# Patient Record
Sex: Female | Born: 1935 | Race: White | Hispanic: No | Smoking: Former smoker
Health system: Southern US, Community
[De-identification: ages and names within clinical notes are randomized; demographics above are authoritative.]

## PROBLEM LIST (undated history)

## (undated) DIAGNOSIS — R55 Syncope and collapse: Secondary | ICD-10-CM

## (undated) DIAGNOSIS — C801 Malignant (primary) neoplasm, unspecified: Secondary | ICD-10-CM

## (undated) DIAGNOSIS — F329 Major depressive disorder, single episode, unspecified: Secondary | ICD-10-CM

## (undated) DIAGNOSIS — F32A Depression, unspecified: Secondary | ICD-10-CM

## (undated) DIAGNOSIS — F419 Anxiety disorder, unspecified: Secondary | ICD-10-CM

## (undated) DIAGNOSIS — K5790 Diverticulosis of intestine, part unspecified, without perforation or abscess without bleeding: Secondary | ICD-10-CM

## (undated) DIAGNOSIS — R748 Abnormal levels of other serum enzymes: Secondary | ICD-10-CM

## (undated) DIAGNOSIS — C4492 Squamous cell carcinoma of skin, unspecified: Secondary | ICD-10-CM

## (undated) DIAGNOSIS — D049 Carcinoma in situ of skin, unspecified: Secondary | ICD-10-CM

## (undated) DIAGNOSIS — N390 Urinary tract infection, site not specified: Secondary | ICD-10-CM

## (undated) DIAGNOSIS — I639 Cerebral infarction, unspecified: Secondary | ICD-10-CM

## (undated) DIAGNOSIS — C4491 Basal cell carcinoma of skin, unspecified: Secondary | ICD-10-CM

## (undated) DIAGNOSIS — M1990 Unspecified osteoarthritis, unspecified site: Secondary | ICD-10-CM

## (undated) DIAGNOSIS — D099 Carcinoma in situ, unspecified: Secondary | ICD-10-CM

## (undated) DIAGNOSIS — E785 Hyperlipidemia, unspecified: Secondary | ICD-10-CM

## (undated) DIAGNOSIS — I10 Essential (primary) hypertension: Secondary | ICD-10-CM

## (undated) HISTORY — DX: Essential (primary) hypertension: I10

## (undated) HISTORY — DX: Syncope and collapse: R55

## (undated) HISTORY — DX: Carcinoma in situ, unspecified: D09.9

## (undated) HISTORY — DX: Unspecified osteoarthritis, unspecified site: M19.90

## (undated) HISTORY — DX: Anxiety disorder, unspecified: F41.9

## (undated) HISTORY — DX: Urinary tract infection, site not specified: N39.0

## (undated) HISTORY — PX: SQUAMOUS CELL CARCINOMA EXCISION: SHX2433

## (undated) HISTORY — DX: Carcinoma in situ of skin, unspecified: D04.9

## (undated) HISTORY — DX: Major depressive disorder, single episode, unspecified: F32.9

## (undated) HISTORY — DX: Basal cell carcinoma of skin, unspecified: C44.91

## (undated) HISTORY — DX: Depression, unspecified: F32.A

## (undated) HISTORY — DX: Squamous cell carcinoma of skin, unspecified: C44.92

## (undated) HISTORY — PX: BASAL CELL CARCINOMA EXCISION: SHX1214

## (undated) HISTORY — DX: Malignant (primary) neoplasm, unspecified: C80.1

## (undated) HISTORY — DX: Diverticulosis of intestine, part unspecified, without perforation or abscess without bleeding: K57.90

## (undated) HISTORY — DX: Cerebral infarction, unspecified: I63.9

## (undated) HISTORY — DX: Hyperlipidemia, unspecified: E78.5

## (undated) HISTORY — DX: Abnormal levels of other serum enzymes: R74.8

---

## 1991-08-19 DIAGNOSIS — C4491 Basal cell carcinoma of skin, unspecified: Secondary | ICD-10-CM

## 1991-08-19 HISTORY — DX: Basal cell carcinoma of skin, unspecified: C44.91

## 1999-05-23 ENCOUNTER — Other Ambulatory Visit: Admission: RE | Admit: 1999-05-23 | Discharge: 1999-05-23 | Payer: Self-pay | Admitting: Internal Medicine

## 2000-10-16 ENCOUNTER — Other Ambulatory Visit: Admission: RE | Admit: 2000-10-16 | Discharge: 2000-10-16 | Payer: Self-pay | Admitting: Internal Medicine

## 2001-03-16 DIAGNOSIS — D099 Carcinoma in situ, unspecified: Secondary | ICD-10-CM

## 2001-03-16 HISTORY — DX: Carcinoma in situ, unspecified: D09.9

## 2006-04-02 DIAGNOSIS — D049 Carcinoma in situ of skin, unspecified: Secondary | ICD-10-CM

## 2006-04-02 HISTORY — DX: Carcinoma in situ of skin, unspecified: D04.9

## 2006-12-28 ENCOUNTER — Encounter: Payer: Self-pay | Admitting: Internal Medicine

## 2008-02-21 ENCOUNTER — Ambulatory Visit: Payer: Self-pay | Admitting: Gastroenterology

## 2008-03-06 ENCOUNTER — Ambulatory Visit: Payer: Self-pay | Admitting: Gastroenterology

## 2009-08-29 DIAGNOSIS — C4491 Basal cell carcinoma of skin, unspecified: Secondary | ICD-10-CM

## 2009-08-29 HISTORY — DX: Basal cell carcinoma of skin, unspecified: C44.91

## 2010-01-29 ENCOUNTER — Encounter: Payer: Self-pay | Admitting: Internal Medicine

## 2010-02-05 ENCOUNTER — Encounter: Payer: Self-pay | Admitting: Internal Medicine

## 2010-02-06 DIAGNOSIS — R079 Chest pain, unspecified: Secondary | ICD-10-CM | POA: Insufficient documentation

## 2010-02-06 DIAGNOSIS — I10 Essential (primary) hypertension: Secondary | ICD-10-CM | POA: Insufficient documentation

## 2010-02-06 DIAGNOSIS — E785 Hyperlipidemia, unspecified: Secondary | ICD-10-CM | POA: Insufficient documentation

## 2010-02-07 ENCOUNTER — Ambulatory Visit: Payer: Self-pay | Admitting: Internal Medicine

## 2010-03-07 ENCOUNTER — Ambulatory Visit: Payer: Self-pay

## 2010-03-07 ENCOUNTER — Encounter: Payer: Self-pay | Admitting: Internal Medicine

## 2010-03-07 ENCOUNTER — Ambulatory Visit (HOSPITAL_COMMUNITY): Admission: RE | Admit: 2010-03-07 | Discharge: 2010-03-07 | Payer: Self-pay | Admitting: Cardiology

## 2010-03-07 ENCOUNTER — Encounter (INDEPENDENT_AMBULATORY_CARE_PROVIDER_SITE_OTHER): Payer: Self-pay | Admitting: *Deleted

## 2010-03-07 ENCOUNTER — Ambulatory Visit: Payer: Self-pay | Admitting: Cardiology

## 2010-03-12 ENCOUNTER — Ambulatory Visit: Payer: Self-pay | Admitting: Internal Medicine

## 2010-03-12 DIAGNOSIS — R0602 Shortness of breath: Secondary | ICD-10-CM | POA: Insufficient documentation

## 2010-03-12 DIAGNOSIS — R609 Edema, unspecified: Secondary | ICD-10-CM | POA: Insufficient documentation

## 2010-03-19 LAB — CONVERTED CEMR LAB
Albumin: 3.8 g/dL (ref 3.5–5.2)
BUN: 23 mg/dL (ref 6–23)
Basophils Absolute: 0 10*3/uL (ref 0.0–0.1)
Chloride: 102 meq/L (ref 96–112)
Eosinophils Absolute: 0.3 10*3/uL (ref 0.0–0.7)
GFR calc non Af Amer: 88.68 mL/min (ref >60)
Glucose, Bld: 96 mg/dL (ref 70–99)
HCT: 38.5 % (ref 36.0–46.0)
Hemoglobin: 13.2 g/dL (ref 12.0–15.0)
Lymphs Abs: 1.7 10*3/uL (ref 0.7–4.0)
MCHC: 34.4 g/dL (ref 30.0–36.0)
MCV: 92.6 fL (ref 78.0–100.0)
Neutro Abs: 3 10*3/uL (ref 1.4–7.7)
Potassium: 4.4 meq/L (ref 3.5–5.1)
RDW: 14.2 % (ref 11.5–14.6)

## 2010-03-21 ENCOUNTER — Ambulatory Visit: Payer: Self-pay | Admitting: Internal Medicine

## 2010-03-22 ENCOUNTER — Inpatient Hospital Stay (HOSPITAL_BASED_OUTPATIENT_CLINIC_OR_DEPARTMENT_OTHER): Admission: RE | Admit: 2010-03-22 | Discharge: 2010-03-22 | Payer: Self-pay | Admitting: Internal Medicine

## 2010-03-22 ENCOUNTER — Ambulatory Visit: Payer: Self-pay | Admitting: Internal Medicine

## 2010-03-28 LAB — CONVERTED CEMR LAB
CO2: 30 meq/L (ref 19–32)
Calcium: 9.2 mg/dL (ref 8.4–10.5)
Chloride: 100 meq/L (ref 96–112)
Sodium: 136 meq/L (ref 135–145)

## 2010-06-25 ENCOUNTER — Ambulatory Visit: Payer: Self-pay | Admitting: Internal Medicine

## 2010-09-24 ENCOUNTER — Ambulatory Visit: Payer: Self-pay | Admitting: Internal Medicine

## 2010-11-03 HISTORY — PX: CATARACT EXTRACTION: SUR2

## 2010-12-03 NOTE — Assessment & Plan Note (Signed)
Summary: PER CHECK OUT   Visit Type:  Follow-up Primary Kashus Karlen:  Dr Geoffry Paradise  CC:  pt states she sometimes forgets to take her lasix and kdur. pt has sob with exer.Marland Kitchen  History of Present Illness: Pam Mejia is 77 y/o woman with  HTN and previous TIA x2. Referred earlier this year by  Dr. Jacky Kindle for further evaluation of 2 episodes of CP.  Had stress echo which taised concern about ischemia in distal septum. F/u cath 03/22/2010: Normal cors. Normal LV function. LVEDP 14. BNP ranges 100-137.   Under a lot of stress taking care of her husband with advanced CHF and 4 year old daughter who is moving back home after losing her job.   For daily activities feels fine. But when she hurries feels SOB. [place] doing rec[place] and treadmill but unable to do it more than 2 times per week or stay more than 20 minutes due to her husband. Gets a little SOB doing those activities. Feels like she is getting stronger. No problem with groin site.   Feels overwhelmed and stressed. No interested in anti-depressants. Says she will just drink her 2 toddies (Candaian VO) prior to dinner.      Current Medications (verified): 1)  Triamterene-Hctz 37.5-25 Mg Caps (Triamterene-Hctz) .... Take 1 Tablet By Mouth Once A Day 2)  Aspirin 81 Mg Tbec (Aspirin) .... Take One Tablet By Mouth Daily 3)  Calcium Carbonate-Vitamin D 600-400 Mg-Unit  Tabs (Calcium Carbonate-Vitamin D) .... Take 2 Tabs By Mouth Two Times A Day 4)  Glucosamine-Chondroitin   Caps (Glucosamine-Chondroit-Vit C-Mn) .... Once Daily 5)  Vitamin D 1000 Unit  Tabs (Cholecalciferol) .... Once Daily 6)  Furosemide 20 Mg Tabs (Furosemide) .... Take One Tablet By Mouth Daily On Mondays and Fridays 7)  Potassium Chloride Crys Cr 20 Meq Cr-Tabs (Potassium Chloride Crys Cr) .... Take One Tablet By Mouth Daily On Mondays and Fridays  Allergies (verified): No Known Drug Allergies  Past History:  Past Medical History: Last updated:  02/07/2010 1. Chest Pain 2. HTN 3. GERD 4. Hx of TIA 5. Osteopenia 6. Osteoarthritis  Review of Systems       As per HPI and past medical history; otherwise all systems negative.   Vital Signs:  Patient profile:   77 year old female Height:      64 inches Weight:      160 pounds BMI:     27.56 Pulse rate:   57 / minute BP sitting:   140 / 80  (left arm) Cuff size:   regular  Vitals Entered By: Burnett Kanaris, CNA (June 25, 2010 10:09 AM)  Physical Exam  General:  Gen: elderly well appearing. no resp difficulty HEENT: normal Neck: supple. JVP flat. Carotids 2+ bilat; no bruits. No lymphadenopathy or thryomegaly appreciated. Cor: PMI nondisplaced. Regular rate & rhythm. No rubs, gallops, murmur. Lungs: clear Abdomen: soft, nontender, nondistended. No hepatosplenomegaly. No bruits or masses. Good bowel sounds. Extremities: no cyanosis, clubbing, rash, tr edema Neuro: alert & orientedx3, cranial nerves grossly intact. moves all 4 extremities w/o difficulty. affect pleasant    Impression & Recommendations:  Problem # 1:  DYSPNEA (ICD-786.05) Her cardiac cath is very reassuring. She does appear to have some mild diastolic dysfunction but LVEDP was normal at cath. I suspect a large component of her symptoms are likely due to emotional stress and she admits to this. I urged her to try to continue with her exercise regimen 3x/week and take care of  her self. I also asked her to discuss the possibility of counseling or taking an anti-depressant with Dr. Dawson Bills and be careful with her alcohol intake. She agreed to this and was a bit tearful with this discussion. If symptoms persist, we will consider cardiopulmonary exercise test to further evaluate.   Other Orders: EKG w/ Interpretation (93000)  Patient Instructions: 1)  Your physician recommends that you schedule a follow-up appointment in: 3 months. 2)  Your physician recommends that you continue on your current medications  as directed. Please refer to the Current Medication list given to you today.

## 2010-12-03 NOTE — Assessment & Plan Note (Signed)
Summary: 1 month rov   Visit Type:  Follow-up Primary Provider:  Dr Geoffry Paradise  CC:  shortness of breath.  History of Present Illness: Pam Mejia is 77 y/o woman with  HTN and previous TIA x2. Referred last month by  Dr. Jacky Kindle for further evaluation of 2 episodes of CP.  Had stress echo last Thursday which has not been formally read yet. I looked at images briefly and appeared relatively normal.  Walked on TM for 6 minutes. No signifcant ST abnormalities.   Notes that she gets SBO going up and down stairs. Feels it is getting worse. No CP or wheezing. Does have mild ankle swelling by end of the day. Doing water aerobics. Does have some dyspnea with this but unchanged. No orthopnea or PND. No cough. Had CXR recently with Dr. Sharma Covert she says was fine.   Still taking care of husband with CHF.   Addendum: Reviewed stress echo result with Dr. Jens Som and he raised concerned about stress induced ischemia in distal septum.    Current Medications (verified): 1)  Triamterene-Hctz 37.5-25 Mg Caps (Triamterene-Hctz) .... Take 1 Tablet By Mouth Once A Day 2)  Aspirin 81 Mg Tbec (Aspirin) .... Take One Tablet By Mouth Daily 3)  Calcium Carbonate-Vitamin D 600-400 Mg-Unit  Tabs (Calcium Carbonate-Vitamin D) .... Take 2 Tabs By Mouth Two Times A Day 4)  Glucosamine-Chondroitin   Caps (Glucosamine-Chondroit-Vit C-Mn) .... Once Daily 5)  Vitamin D 1000 Unit  Tabs (Cholecalciferol) .... Once Daily  Allergies (verified): No Known Drug Allergies  Past History:  Past Surgical History: Last updated: 02/06/2010 removal of recurrent squamous cell ca --- 2/10 removal of basal cell ca ---9/08 & 10/08  Family History: Last updated: 02/06/2010 Family History of Cancer:  Parkinson's disease  Social History: Last updated: 02/07/2010 Married w/ 2 children & 2 grandchildren Tobacco Use - No.  Alcohol Use - yes -- little daily Regular Exercise - yes Drug Use - no Retired   Risk  Factors: Exercise: yes (02/06/2010)  Risk Factors: Smoking Status: never (02/06/2010)  Past Medical History: Reviewed history from 02/07/2010 and no changes required. 1. Chest Pain 2. HTN 3. GERD 4. Hx of TIA 5. Osteopenia 6. Osteoarthritis  Review of Systems       As per HPI and past medical history; otherwise all systems negative.   Vital Signs:  Patient profile:   77 year old female Height:      64 inches Weight:      165 pounds BMI:     28.42 Pulse rate:   65 / minute BP sitting:   126 / 68  (left arm) Cuff size:   regular  Vitals Entered By: Hardin Negus, RMA (Mar 12, 2010 10:57 AM)  Physical Exam  General:  Gen: elderly well appearing. no resp difficulty HEENT: normal Neck: supple. JVP 6. Carotids 2+ bilat; no bruits. No lymphadenopathy or thryomegaly appreciated. Cor: PMI nondisplaced. Regular rate & rhythm. No rubs, gallops, murmur. Lungs: clear Abdomen: soft, nontender, nondistended. No hepatosplenomegaly. No bruits or masses. Good bowel sounds. Extremities: no cyanosis, clubbing, rash, 1+ edema Neuro: alert & orientedx3, cranial nerves grossly intact. moves all 4 extremities w/o difficulty. affect pleasant    Impression & Recommendations:  Problem # 1:  CHEST PAIN-UNSPECIFIED (ICD-786.50) Resolved. However, stress echo reviewed with Dr. Jens Som after she left and is suggestive of possible ischemia. We will contact her to discuss the possiblity of proceeding to R and L heart cath to further evlauate.   Problem #  2:  DYSPNEA (ICD-786.05) Likely multifactorial. Does appear to have component of diastolic dysfunction with very mild volume overload. Will try adding lasix 20/kcl 20 on Mon and Fri and see if it helps. Chceck CBC, BMET and BNP. Given results of stress echo will likely need cath.    Other Orders: TLB-BMP (Basic Metabolic Panel-BMET) (80048-METABOL) TLB-BNP (B-Natriuretic Peptide) (83880-BNPR) TLB-CBC Platelet - w/Differential  (85025-CBCD) TLB-Hepatic/Liver Function Pnl (80076-HEPATIC)  Patient Instructions: 1)  Labs today 2)  Furosemide 20mg  on Mondays and Fridays 3)  Potassium on Mondays and Fridays 4)  Your physician recommends that you return for lab work in: 2 weeks (bmet, bnp 786.05, 786.51) 5)  Follow up in 2-3 months Prescriptions: POTASSIUM CHLORIDE CRYS CR 20 MEQ CR-TABS (POTASSIUM CHLORIDE CRYS CR) Take one tablet by mouth daily on Mondays and Fridays  #10 x 3   Entered by:   Meredith Staggers, RN   Authorized by:   Dolores Patty, MD, [facility]   Signed by:   Meredith Staggers, RN on 03/12/2010   Method used:   Electronically to        [facility] [address] #[id number]* (retail)       [address]       [place], [place]  [address]       Ph: [phone]       Fax: [phone]   RxID:   [id number] FUROSEMIDE 20 MG TABS (FUROSEMIDE) Take one tablet by mouth daily on Mondays and Fridays  #10 x 3   Entered by:   Meredith Staggers, RN   Authorized by:   Dolores Patty, MD, [facility]   Signed by:   Meredith Staggers, RN on 03/12/2010   Method used:   Electronically to        [facility] [address] #[id number]* (retail)       [address]       [place], [place]  [address]       Ph: [phone]       Fax: [phone]   RxID:   [id number]

## 2010-12-03 NOTE — Letter (Signed)
Summary: Outpatient Coinsurance Notice  Outpatient Coinsurance Notice   Imported By: Marylou Mccoy 03/08/2010 15:11:45  _____________________________________________________________________  External Attachment:    Type:   Image     Comment:   External Document

## 2010-12-03 NOTE — Cardiovascular Report (Signed)
Summary: Pre Cath Orders  Pre Cath Orders   Imported By: Roderic Ovens 03/28/2010 12:38:57  _____________________________________________________________________  External Attachment:    Type:   Image     Comment:   External Document

## 2010-12-03 NOTE — Letter (Signed)
Summary: [facility] Office Note  [facility] Office Note   Imported By: Roderic Ovens 02/15/2010 13:51:22  _____________________________________________________________________  External Attachment:    Type:   Image     Comment:   External Document

## 2010-12-03 NOTE — Assessment & Plan Note (Signed)
Summary: np6/ per dr.aronson/ chestpain x 2/work up / pt has blue medi...   Visit Type:  Initial Consult Primary Provider:  Dr Geoffry Paradise  CC:  chest pain x 2 in Feb.  History of Present Illness: 77 y/o HTN and previous TIA x2. Referred b Dr. Jacky Kindle for further evaluation of 2 episodes of CP.  Typically very active but less so latel as she has been taking care of husband suffering from CHF. Ver stressed.  Still tries to go to gym 3x/week and does recumbent bike and weights.  In February woke up from sleep with pain in side under L breast. Lasted only a few minutes and then subsided. No other sx.  Had another episode of pain when riding recumbent bike. Brief. Stopped with cessation of exercise. Never returned. Occasional SOB with swimming.   No fevers, chills. Nonporductive cough due to allergies.   Recent lipid TC 196 TG 56 HDL 98(!) LDL 87  Problems Prior to Update: 1)  Hyperlipidemia-mixed  (ICD-272.4) 2)  Hypertension, Unspecified  (ICD-401.9) 3)  Chest Pain-unspecified  (ICD-786.50)  Medications Prior to Update: 1)  Triamterene-Hctz 37.5-25 Mg Caps (Triamterene-Hctz) .... Take 1 Tablet By Mouth Once A Day 2)  Aspirin 81 Mg Tbec (Aspirin) .... Take One Tablet By Mouth Daily 3)  Calcium Carbonate-Vitamin D 600-400 Mg-Unit  Tabs (Calcium Carbonate-Vitamin D) .... Take 2 Tabs By Mouth Two Times A Day 4)  Glucosamine-Chondroitin   Caps (Glucosamine-Chondroit-Vit C-Mn) .... Once Daily  Current Medications (verified): 1)  Triamterene-Hctz 37.5-25 Mg Caps (Triamterene-Hctz) .... Take 1 Tablet By Mouth Once A Day 2)  Aspirin 81 Mg Tbec (Aspirin) .... Take One Tablet By Mouth Daily 3)  Calcium Carbonate-Vitamin D 600-400 Mg-Unit  Tabs (Calcium Carbonate-Vitamin D) .... Take 2 Tabs By Mouth Two Times A Day 4)  Glucosamine-Chondroitin   Caps (Glucosamine-Chondroit-Vit C-Mn) .... Once Daily 5)  Vitamin D 1000 Unit  Tabs (Cholecalciferol) .... Once Daily  Allergies (verified): No  Known Drug Allergies  Past History:  Past Medical History: 1. Chest Pain 2. HTN 3. GERD 4. Hx of TIA 5. Osteopenia 6. Osteoarthritis  Family History: Reviewed history from 02/06/2010 and no changes required. Family History of Cancer:  Parkinson's disease  Social History: Reviewed history from 02/06/2010 and no changes required. Married w/ 2 children & 2 grandchildren Tobacco Use - No.  Alcohol Use - yes -- little daily Regular Exercise - yes Drug Use - no Retired   Review of Systems       As per HPI and past medical history; otherwise all systems negative.   Vital Signs:  Patient profile:   77 year old female Height:      64 inches Weight:      165 pounds BMI:     28.42 Pulse rate:   58 / minute BP sitting:   146 / 74  (left arm) Cuff size:   regular  Vitals Entered By: Hardin Negus, RMA (February 07, 2010 2:43 PM)  Physical Exam  General:  Gen: elderly well appearing. no resp difficulty HEENT: normal Neck: supple. no JVD. Carotids 2+ bilat; no bruits. No lymphadenopathy or thryomegaly appreciated. Cor: PMI nondisplaced. Regular rate & rhythm. No rubs, gallops, murmur. Lungs: clear Abdomen: soft, nontender, nondistended. No hepatosplenomegaly. No bruits or masses. Good bowel sounds. Extremities: no cyanosis, clubbing, rash, edema Neuro: alert & orientedx3, cranial nerves grossly intact. moves all 4 extremities w/o difficulty. affect pleasant    Impression & Recommendations:  Problem # 1:  CHEST  PAIN-UNSPECIFIED (ICD-786.50) Mostly atypical but does have risk factors. Has agreed to participate in [facility] trial of traditional stress testing (stress echo in this case) vs coronary CT.   Problem # 2:  HYPERTENSION, UNSPECIFIED (ICD-401.9) Blood pressure well controlled. Continue current regimen.  Other Orders: EKG w/ Interpretation (93000) Stress Echo (Stress Echo)  Patient Instructions: 1)  Your physician has requested that you have a stress  echocardiogram. For further information please visit [email or web address]  Please follow instruction sheet as given. 2)  Follow up in 1 month

## 2010-12-03 NOTE — Letter (Signed)
Summary: Cardiac Catheterization Instructions- [facility]  [facility], Main Office  [address]   [place], [place] [address]   Phone: [phone]  Fax: [phone]     03/12/2010 MRN: [id number]  Pam Mejia [address] [place], [place]  [address]  Dear [name],   You are scheduled for a Cardiac Catheterization on Friday 03/22/10 with Dr. Gala Romney  Please arrive to the 1st floor of the [facility] at [facility] at 8:30 am / pm on the day of your procedure. Please do not arrive before 6:30 a.m. Call the Heart and Vascular Center at [phone] if you are unable to make your appointmnet. The Code to get into the parking garage under the building is 0010. Take the elevators to the 1st floor. You must have someone to drive you home. Someone must be with you for the first 24 hours after you arrive home. Please wear clothes that are easy to get on and off and wear slip-on shoes. Do not eat or drink after midnight except water with your medications that morning. Bring all your medications and current insurance cards with you.  _X__ DO NOT take these medications before your procedure: ______triamterene/hctz__________________________________________________________  ___ Make sure you take your aspirin.  ___ You may take ALL of your medications with water that morning. ________________________________________________________________________________________________________________________________  ___ DO NOT take ANY medications before your procedure.  ___ Pre-med instructions:  ________________________________________________________________________________________________________________________________  The usual length of stay after your procedure is 2 to 3 hours. This can vary.  If you have any questions, please call the office at the number listed above.   Meredith Staggers, RN

## 2010-12-03 NOTE — Assessment & Plan Note (Signed)
Summary: rov   Primary Provider:  Dr Pam Mejia   History of Present Illness: Pam Mejia is 77 y/o woman with  HTN and previous TIA x2. Referred earlier this year by  Dr. Jacky Kindle for further evaluation of 2 episodes of CP and dyspnea.   Had stress echo which raised concern about ischemia in distal septum. F/u cath 03/22/2010: Normal cors. Normal LV function. LVEDP 14. BNP ranges 100-137.   Under a lot of stress taking care of her husband with advanced CHF and 16 year old daughter who moved back home after losing her job.   Says she feels fine. Denies chest or SOB.  Trying her best to be active. Going to pool 2x/week.        Current Medications (verified): 1)  Triamterene-Hctz 37.5-25 Mg Caps (Triamterene-Hctz) .... Take 1 Tablet By Mouth Once A Day 2)  Aspirin 81 Mg Tbec (Aspirin) .... Take One Tablet By Mouth Daily 3)  Calcium Carbonate-Vitamin D 600-400 Mg-Unit  Tabs (Calcium Carbonate-Vitamin D) .... Take 2 Tabs By Mouth Two Times A Day 4)  Glucosamine-Chondroitin   Caps (Glucosamine-Chondroit-Vit C-Mn) .... Once Daily 5)  Vitamin D 1000 Unit  Tabs (Cholecalciferol) .... Once Daily 6)  Furosemide 20 Mg Tabs (Furosemide) .... Take One Tablet By Mouth Daily On Mondays and Fridays 7)  Potassium Chloride Crys Cr 20 Meq Cr-Tabs (Potassium Chloride Crys Cr) .... Take One Tablet By Mouth Daily On Mondays and Fridays  Allergies (verified): No Known Drug Allergies  Past History:  Past Medical History: 1. Chest Pain    --cath with normal coronaries 5/11 2. HTN 3. GERD 4. Hx of TIA 5. Osteopenia 6. Osteoarthritis  Review of Systems       As per HPI and past medical history; otherwise all systems negative.   Vital Signs:  Patient profile:   77 year old female Height:      64 inches Weight:      163 pounds BMI:     28.08 Pulse rate:   62 / minute Resp:     16 per minute BP sitting:   122 / 60  (right arm)  Vitals Entered By: Marrion Coy, CNA (September 24, 2010 10:17  AM)  Physical Exam  General:  Elderly well appearing. no resp difficulty HEENT: normal except for bandage on nose at site MOHS surgery Neck: supple. JVP flat. Carotids 2+ bilat; no bruits. No lymphadenopathy or thryomegaly appreciated. Cor: PMI nondisplaced. Regular rate & rhythm. No rubs, gallops, murmur. Lungs: clear Abdomen: soft, nontender, nondistended. No hepatosplenomegaly. No bruits or masses. Good bowel sounds. Extremities: no cyanosis, clubbing, rash, tr edema Neuro: alert & orientedx3, cranial nerves grossly intact. moves all 4 extremities w/o difficulty. affect pleasant    Impression & Recommendations:  Problem # 1:  CHEST PAIN-UNSPECIFIED (ICD-786.50)/Dyspnea Cath normal. Symptoms resolved. Can f/u as needed. Continue exercise program. Asked her to call me with problems.   Patient Instructions: 1)  We will see you back on an as needed basis.

## 2011-01-20 LAB — PROTIME-INR
INR: 0.92 (ref 0.00–1.49)
Prothrombin Time: 12.3 seconds (ref 11.6–15.2)

## 2011-02-04 ENCOUNTER — Ambulatory Visit: Payer: BC Managed Care – HMO | Admitting: Internal Medicine

## 2011-04-03 ENCOUNTER — Encounter: Payer: Self-pay | Admitting: Cardiology

## 2011-04-03 ENCOUNTER — Other Ambulatory Visit: Payer: Self-pay | Admitting: Internal Medicine

## 2011-05-12 ENCOUNTER — Other Ambulatory Visit: Payer: Self-pay

## 2011-08-28 ENCOUNTER — Other Ambulatory Visit: Payer: Self-pay | Admitting: Internal Medicine

## 2011-10-14 ENCOUNTER — Other Ambulatory Visit: Payer: Self-pay | Admitting: Internal Medicine

## 2012-03-10 ENCOUNTER — Encounter: Payer: Self-pay | Admitting: Gastroenterology

## 2012-04-01 ENCOUNTER — Encounter: Payer: Self-pay | Admitting: Gastroenterology

## 2012-04-01 ENCOUNTER — Ambulatory Visit (INDEPENDENT_AMBULATORY_CARE_PROVIDER_SITE_OTHER): Payer: Medicare Other | Admitting: Gastroenterology

## 2012-04-01 VITALS — BP 128/70 | HR 72 | Ht 64.0 in | Wt 165.6 lb

## 2012-04-01 DIAGNOSIS — R195 Other fecal abnormalities: Secondary | ICD-10-CM

## 2012-04-01 MED ORDER — MOVIPREP 100 G PO SOLR: 1.0000 | Freq: Once | ORAL | Status: DC

## 2012-04-01 NOTE — Patient Instructions (Signed)
You have been scheduled for a colonoscopy with propofol. Please follow written instructions given to you at your visit today.  Please pick up your prep kit at the pharmacy within the next 1-3 days. cc: Geoffry Paradise, MD

## 2012-04-01 NOTE — Progress Notes (Signed)
History of Present Illness: This is an 78 year old female recently found to have Hemoccult positive stool. She occasionally notes straining with bowel movements. She previously underwent colonoscopy in March 2003 showing a small hyperplastic colon polyp, diverticulosis and internal hemorrhoids-that were treated. She underwent colonoscopy in May 2009 for hematochezia with diverticulosis and internal and external hemorrhoids noted. Her internal hemorrhoids were retreated. Denies weight loss, abdominal pain, diarrhea, change in stool caliber, melena, hematochezia, nausea, vomiting, dysphagia, reflux symptoms, chest pain.  No Known Allergies Outpatient Prescriptions Prior to Visit  Medication Sig Dispense Refill  . aspirin 81 MG tablet Take 81 mg by mouth daily.        . furosemide (LASIX) 20 MG tablet TAKE 1 TABLET BY MOUTH DAILY ON MONDAYS AND FRIDAYS  10 tablet  9  . lisinopril-hydrochlorothiazide (PRINZIDE,ZESTORETIC) 20-25 MG per tablet Take 1 tablet by mouth daily.        . potassium chloride SA (K-DUR,KLOR-CON) 20 MEQ tablet TAKE 1 TABLET BY MOUTH DAILY ON MONDAYS AND FRIDAYS  10 tablet  6  . Travoprost (TRAVATAN OP) Apply 1 drop to eye. Both eyes        . ciprofloxacin (CIPRO XR) 500 MG 24 hr tablet Take 500 mg by mouth 2 (two) times daily.        . simvastatin (ZOCOR) 40 MG tablet Take 40 mg by mouth at bedtime.         Past Medical History  Diagnosis Date  . UTI (urinary tract infection)   . Syncope   . Elevated CK     likely secondary to trauma  . HTN (hypertension)   . Hyperlipidemia   . Glaucoma   . Osteoarthritis   . Diverticulosis   . Migraine    Past Surgical History  Procedure Date  . Squamous cell carcinoma excision   . Basal cell carcinoma excision   . Cataract extraction    History   Social History  . Marital Status: Married    Spouse Name: N/A    Number of Children: 2  . Years of Education: N/A   Occupational History  . Retired    Social History Main  Topics  . Smoking status: Never Smoker   . Smokeless tobacco: Never Used  . Alcohol Use: Yes  . Drug Use: No  . Sexually Active: None   Other Topics Concern  . None   Social History Narrative  . None   Family History  Problem Relation Age of Onset  . Stomach cancer Mother   . Heart disease Father     Review of Systems: Pertinent positive and negative review of systems were noted in the above HPI section. All other review of systems were otherwise negative.  Physical Exam: General: Well developed , well nourished, no acute distress Head: Normocephalic and atraumatic Eyes:  sclerae anicteric, EOMI Ears: Normal auditory acuity Mouth: No deformity or lesions Neck: Supple, no masses or thyromegaly Lungs: Clear throughout to auscultation Heart: Regular rate and rhythm; no murmurs, rubs or bruits Abdomen: Soft, non tender and non distended. No masses, hepatosplenomegaly or hernias noted. Normal Bowel sounds Rectal: Deferred to colonoscopy Musculoskeletal: Symmetrical with no gross deformities  Skin: No lesions on visible extremities Pulses:  Normal pulses noted Extremities: No clubbing, cyanosis, edema or deformities noted Neurological: Alert oriented x 4, grossly nonfocal Cervical Nodes:  No significant cervical adenopathy Inguinal Nodes: No significant inguinal adenopathy Psychological:  Alert and cooperative. Normal mood and affect  Assessment and Recommendations:  1. Hemoccult-positive stool  and constipation. Rule out colorectal lesions. I suspect hemorrhoids are the most likely cause of Hemoccult-positive stool given that she underwent colonoscopy in 2009. Increase dietary fiber and water intake. The risks, benefits, and alternatives to colonoscopy with possible biopsy and possible polypectomy were discussed with the patient and they consent to proceed.

## 2012-04-07 ENCOUNTER — Encounter: Payer: Self-pay | Admitting: Gastroenterology

## 2012-05-29 ENCOUNTER — Other Ambulatory Visit: Payer: Self-pay | Admitting: Internal Medicine

## 2012-05-31 ENCOUNTER — Other Ambulatory Visit (HOSPITAL_COMMUNITY): Payer: Self-pay

## 2012-05-31 MED ORDER — POTASSIUM CHLORIDE CRYS ER 20 MEQ PO TBCR: 20.0000 meq | EXTENDED_RELEASE_TABLET | Freq: Every day | ORAL | Status: DC

## 2012-05-31 NOTE — Telephone Encounter (Signed)
..   Requested Prescriptions   Signed Prescriptions Disp Refills  . potassium chloride SA (K-DUR,KLOR-CON) 20 MEQ tablet 10 tablet 6    Sig: Take 1 tablet (20 mEq total) by mouth daily.    Authorizing Provider: Dolores Patty    Ordering User: Christella Hartigan, Abhiraj Dozal Judie Petit

## 2012-06-01 ENCOUNTER — Encounter: Payer: Self-pay | Admitting: Gastroenterology

## 2012-06-01 ENCOUNTER — Ambulatory Visit (AMBULATORY_SURGERY_CENTER): Payer: Medicare Other | Admitting: Gastroenterology

## 2012-06-01 VITALS — BP 142/70 | HR 49 | Temp 97.6°F | Resp 18 | Ht 64.0 in | Wt 165.0 lb

## 2012-06-01 DIAGNOSIS — K621 Rectal polyp: Secondary | ICD-10-CM

## 2012-06-01 DIAGNOSIS — D126 Benign neoplasm of colon, unspecified: Secondary | ICD-10-CM

## 2012-06-01 DIAGNOSIS — K620 Anal polyp: Secondary | ICD-10-CM

## 2012-06-01 DIAGNOSIS — R195 Other fecal abnormalities: Secondary | ICD-10-CM

## 2012-06-01 MED ORDER — SODIUM CHLORIDE 0.9 % IV SOLN: 500.0000 mL | INTRAVENOUS | Status: DC

## 2012-06-01 NOTE — Progress Notes (Signed)
Pressure applied to the abdomen to reach the cecum 

## 2012-06-01 NOTE — Patient Instructions (Addendum)

## 2012-06-01 NOTE — Op Note (Signed)
[facility] [address] [place], [place]  [address]  COLONOSCOPY PROCEDURE REPORT PATIENT:  Pam Mejia, Pam Mejia  MR#:  [id number] BIRTHDATE:  18-Jan-1929, 83 yrs. old  GENDER:  female ENDOSCOPIST:  Judie Petit T. Russella Dar, MD, [facility]  PROCEDURE DATE:  06/01/2012 PROCEDURE:  Colonoscopy with snare polypectomy ASA CLASS:  Class II INDICATIONS:  1) heme positive stool MEDICATIONS:   MAC sedation, administered by CRNA, propofol (Diprivan) 80 mg IV DESCRIPTION OF PROCEDURE:   After the risks benefits and alternatives of the procedure were thoroughly explained, informed consent was obtained.  Digital rectal exam was performed and revealed external hemorrhoids.   The LB [id number]-[id number] endoscope was introduced through the anus and advanced to the cecum, which was identified by both the appendix and ileocecal valve, without limitations.  The quality of the prep was excellent, using MoviPrep.  The instrument was then slowly withdrawn as the colon was fully examined. <<PROCEDUREIMAGES>> FINDINGS:  Moderate diverticulosis was found in the sigmoid colon. A sessile polyp was found in the rectum. It was 6 mm in size. Polyp was snared without cautery. Retrieval was successful. Otherwise normal colonoscopy without other polyps, masses, vascular ectasias, or inflammatory changes.   Retroflexed views in the rectum revealed internal hemorrhoids, large.  The time to cecum =  5.5  minutes. The scope was then withdrawn (time =  11.25 min) from the patient and the procedure completed.  COMPLICATIONS:  None  ENDOSCOPIC IMPRESSION: 1) Moderate diverticulosis in the sigmoid colon 2) 6 mm sessile polyp in the rectum 3) Internal and external hemorrhoids  RECOMMENDATIONS: 1) Await pathology results 2) High fiber diet with liberal fluid intake. 3) Given your age, you will not need another colonoscopy for colon cancer screening or polyp surveillance. These types of tests usually stop around the age  59.  Venita Lick. Russella Dar, MD, [facility]  CC:  Geoffry Paradise, MD  n. Rosalie DoctorVenita Lick. Masayuki Sakai at 06/01/2012 11:31 AM  [place], [phone]

## 2012-06-01 NOTE — Progress Notes (Signed)
Patient did not experience any of the following events: a burn prior to discharge; a fall within the facility; wrong site/side/patient/procedure/implant event; or a hospital transfer or hospital admission upon discharge from the facility. (G8907) Patient did not have preoperative order for IV antibiotic SSI prophylaxis. (G8918)  

## 2012-06-02 ENCOUNTER — Telehealth: Payer: Self-pay | Admitting: *Deleted

## 2012-06-02 NOTE — Telephone Encounter (Signed)
  Follow up Call-  Call back number 06/01/2012  Post procedure Call Back phone  # [phone]  Permission to leave phone message Yes     Patient questions:  Do you have a fever, pain , or abdominal swelling? no Pain Score  0 *  Have you tolerated food without any problems? yes  Have you been able to return to your normal activities? yes  Do you have any questions about your discharge instructions: Diet   no Medications  no Follow up visit  no  Do you have questions or concerns about your Care? no  Actions: * If pain score is 4 or above: No action needed, pain <4.

## 2012-06-02 NOTE — Telephone Encounter (Signed)
Duplicate entry.  Phone call not made.

## 2012-06-07 ENCOUNTER — Encounter: Payer: Self-pay | Admitting: Gastroenterology

## 2012-09-21 ENCOUNTER — Other Ambulatory Visit: Payer: Self-pay | Admitting: Internal Medicine

## 2012-11-10 ENCOUNTER — Other Ambulatory Visit: Payer: Self-pay | Admitting: Dermatology

## 2012-12-28 ENCOUNTER — Telehealth: Payer: Self-pay | Admitting: Internal Medicine

## 2012-12-28 NOTE — Telephone Encounter (Signed)
Dr Gala Romney saw pt in Nov 2011 for CP and advised she could f/u prn, if she is wanting to be seen again she can just see any cardiologist since she does not have heart failure, thanks

## 2012-12-28 NOTE — Telephone Encounter (Signed)
New Problem:    Patient called in wanting to know if she was still eligible to see Dr. Gala Romney in the CHF clinic.  Please let me know.

## 2013-01-13 ENCOUNTER — Ambulatory Visit (INDEPENDENT_AMBULATORY_CARE_PROVIDER_SITE_OTHER): Payer: Medicare Other | Admitting: Internal Medicine

## 2013-01-13 ENCOUNTER — Encounter: Payer: Self-pay | Admitting: Internal Medicine

## 2013-01-13 VITALS — BP 110/68 | HR 61 | Ht 64.0 in | Wt 163.0 lb

## 2013-01-13 DIAGNOSIS — I10 Essential (primary) hypertension: Secondary | ICD-10-CM

## 2013-01-13 DIAGNOSIS — R0602 Shortness of breath: Secondary | ICD-10-CM

## 2013-01-13 NOTE — Progress Notes (Signed)
HPI Pam Mejia is 79 y/o woman with HTN and previous TIA x2. Referred earlier this year by Dr. Jacky Kindle for further evaluation of 2 episodes of CP and dyspnea.  Had stress echo which raised concern about ischemia in distal septum. F/u cath 03/22/2010: Normal cors. Normal LV function. LVEDP 14. BNP ranges 100-137.  She was last seen by D Bensimhon in 2011. She represented for evaluation of shortness of breath.  She remains under increased stress.  Husband is in [place].  She visits him daily  Admits to not being as physically active  Denies CP  No PND. SHe had been on Triamterene as well as Lasix/KCL a few times pper week.  Ran out in December Since running out she notes no real change in her sympotms.  No Known Allergies  Current Outpatient Prescriptions  Medication Sig Dispense Refill  . aspirin 81 MG tablet Take 81 mg by mouth daily.        . diclofenac sodium (VOLTAREN) 1 % GEL Apply 1 application topically as needed.      . Naproxen Sodium (ALEVE) 220 MG CAPS Take 1 capsule by mouth as needed.      . Travoprost (TRAVATAN OP) Apply 1 drop to eye. Both eyes         No current facility-administered medications for this visit.    Past Medical History  Diagnosis Date  . UTI (urinary tract infection)   . Syncope   . Elevated CK     likely secondary to trauma  . HTN (hypertension)   . Hyperlipidemia   . Glaucoma(365)   . Osteoarthritis   . Diverticulosis   . Migraine   . Anxiety   . Cancer     melanoma  . Depression   . Stroke     pt states she has had TIA's    Past Surgical History  Procedure Laterality Date  . Squamous cell carcinoma excision    . Basal cell carcinoma excision    . Cataract extraction  2012    bilateral    Family History  Problem Relation Age of Onset  . Stomach cancer Mother   . Heart disease Father     History   Social History  . Marital Status: Married    Spouse Name: N/A    Number of Children: 2  . Years of Education: N/A   Occupational  History  . Retired    Social History Main Topics  . Smoking status: Never Smoker   . Smokeless tobacco: Never Used  . Alcohol Use: 0.0 oz/week     Comment: 2 mixed drinks/ day  . Drug Use: No  . Sexually Active: Not on file   Other Topics Concern  . Not on file   Social History Narrative  . No narrative on file    Review of Systems:  All systems reviewed.  They are negative to the above problem except as previously stated.  Vital Signs: BP 110/68  Pulse 61  Ht 5\' 4"  (1.626 m)  Wt 163 lb (73.936 kg)  BMI 27.97 kg/m2  SpO2 97%  Physical Exam Patient is in NAD HEENT:  Normocephalic, atraumatic. EOMI, PERRLA.  Neck: JVP is normal.  No bruits.  Lungs: clear to auscultation. No rales no wheezes.  Heart: Regular rate and rhythm. Normal S1, S2. No S3.   No significant murmurs. PMI not displaced.  Abdomen:  Supple, nontender. Normal bowel sounds. No masses. No hepatomegaly.  Extremities:   Good distal pulses throughout. Tr  lower extremity edema.  Musculoskeletal :moving all extremities.  Neuro:   alert and oriented x3.  CN II-XII grossly intact.  EKG  Sinus bradycardia  54 bpm. Assessment and Plan:  1.  SOB  I would recomm labs today (CBC, TSH, BMET and BNP)  She should also get an echo to evla LV and RV function.    2.  Hx TIA.  FOlow.

## 2013-01-13 NOTE — Patient Instructions (Addendum)
LABS TODAY:  BMET, BNP, TSH, CBC  Your physician has requested that you have an echocardiogram. Echocardiography is a painless test that uses sound waves to create images of your heart. It provides your doctor with information about the size and shape of your heart and how well your heart's chambers and valves are working. This procedure takes approximately one hour. There are no restrictions for this procedure.

## 2013-01-14 LAB — CBC WITH DIFFERENTIAL/PLATELET
Basophils Relative: 1 % (ref 0.0–3.0)
Eosinophils Relative: 4.4 % (ref 0.0–5.0)
HCT: 38.1 % (ref 36.0–46.0)
Hemoglobin: 12.9 g/dL (ref 12.0–15.0)
Lymphocytes Relative: 34.7 % (ref 12.0–46.0)
Lymphs Abs: 2.3 10*3/uL (ref 0.7–4.0)
Monocytes Relative: 16.3 % — ABNORMAL HIGH (ref 3.0–12.0)
Neutro Abs: 2.9 10*3/uL (ref 1.4–7.7)
RBC: 4.29 Mil/uL (ref 3.87–5.11)
RDW: 13.5 % (ref 11.5–14.6)
WBC: 6.7 10*3/uL (ref 4.5–10.5)

## 2013-01-14 LAB — BASIC METABOLIC PANEL
BUN: 23 mg/dL (ref 6–23)
CO2: 27 mEq/L (ref 19–32)
Calcium: 8.8 mg/dL (ref 8.4–10.5)
Chloride: 101 mEq/L (ref 96–112)
Creatinine, Ser: 0.7 mg/dL (ref 0.4–1.2)
Glucose, Bld: 103 mg/dL — ABNORMAL HIGH (ref 70–99)

## 2013-01-25 ENCOUNTER — Ambulatory Visit (HOSPITAL_COMMUNITY): Payer: Medicare Other | Attending: Cardiovascular Disease

## 2013-01-25 DIAGNOSIS — R0989 Other specified symptoms and signs involving the circulatory and respiratory systems: Secondary | ICD-10-CM

## 2013-01-25 DIAGNOSIS — Z8673 Personal history of transient ischemic attack (TIA), and cerebral infarction without residual deficits: Secondary | ICD-10-CM | POA: Insufficient documentation

## 2013-01-25 DIAGNOSIS — R0609 Other forms of dyspnea: Secondary | ICD-10-CM

## 2013-01-25 DIAGNOSIS — R55 Syncope and collapse: Secondary | ICD-10-CM | POA: Insufficient documentation

## 2013-01-25 DIAGNOSIS — I10 Essential (primary) hypertension: Secondary | ICD-10-CM | POA: Insufficient documentation

## 2013-01-25 DIAGNOSIS — R079 Chest pain, unspecified: Secondary | ICD-10-CM | POA: Insufficient documentation

## 2013-01-25 DIAGNOSIS — R0602 Shortness of breath: Secondary | ICD-10-CM | POA: Insufficient documentation

## 2013-01-25 NOTE — Progress Notes (Signed)
Echocardiogram performed.  

## 2013-01-31 ENCOUNTER — Telehealth: Payer: Self-pay | Admitting: Internal Medicine

## 2013-01-31 NOTE — Telephone Encounter (Signed)
Pt notified of echo results

## 2013-01-31 NOTE — Telephone Encounter (Signed)
Follow up call   Returning call back to nurse  

## 2013-01-31 NOTE — Telephone Encounter (Signed)
New Problem:    Patient called in returning your call regarding her ECHO results.  Please call back.

## 2013-01-31 NOTE — Telephone Encounter (Signed)
LMTCB

## 2014-12-11 ENCOUNTER — Other Ambulatory Visit: Payer: Self-pay | Admitting: Dermatology

## 2014-12-11 DIAGNOSIS — C4492 Squamous cell carcinoma of skin, unspecified: Secondary | ICD-10-CM

## 2014-12-11 HISTORY — DX: Squamous cell carcinoma of skin, unspecified: C44.92

## 2015-07-14 ENCOUNTER — Ambulatory Visit (INDEPENDENT_AMBULATORY_CARE_PROVIDER_SITE_OTHER): Payer: Medicare Other | Admitting: Physician Assistant

## 2015-07-14 VITALS — BP 134/66 | HR 64 | Temp 97.9°F | Resp 16 | Ht 63.0 in | Wt 159.2 lb

## 2015-07-14 DIAGNOSIS — H6091 Unspecified otitis externa, right ear: Secondary | ICD-10-CM | POA: Diagnosis not present

## 2015-07-14 MED ORDER — OFLOXACIN 0.3 % OT SOLN: 10.0000 [drp] | Freq: Every day | OTIC | Status: AC

## 2015-07-14 NOTE — Progress Notes (Signed)
[facility] [address], [place] [address] [phone]  Date:  07/14/2015   Name:  Pam MCLAINE   DOB:  Oct 24, 1929   MRN:  [id number]  PCP:  Geoffery Lyons, MD    Chief Complaint: Ear Pain   History of Present Illness:  This is a 81 y.o. female with PMH HLD, HTN who is presenting with right ear pain x 1 week. Pain is gradually worsening. Pain located to inner ear and her posterior ear. Both ears feel full but no particular difficulty hearing. No pain in left ear. No drainage from the ear. States she had a cold 5 weeks ago but that resolved. No current URI sx other than ear pain. She denies fever or chills. She has not tried anything for her sx.   Review of Systems:  Review of Systems See HPI  Patient Active Problem List   Diagnosis Date Noted  . EDEMA 03/12/2010  . DYSPNEA 03/12/2010  . HYPERLIPIDEMIA-MIXED 02/06/2010  . HYPERTENSION, UNSPECIFIED 02/06/2010  . CHEST PAIN-UNSPECIFIED 02/06/2010    Prior to Admission medications   Medication Sig Start Date End Date Taking? Authorizing Provider  aspirin 81 MG tablet Take 81 mg by mouth daily.     Yes Historical Provider, MD  diclofenac sodium (VOLTAREN) 1 % GEL Apply 1 application topically as needed.   Yes Historical Provider, MD  Naproxen Sodium (ALEVE) 220 MG CAPS Take 1 capsule by mouth as needed.   Yes Historical Provider, MD  triamterene-hydrochlorothiazide (MAXZIDE-25) 37.5-25 MG per tablet Take 1 tablet by mouth daily.   Yes Historical Provider, MD    No Known Allergies  Past Surgical History  Procedure Laterality Date  . Squamous cell carcinoma excision    . Basal cell carcinoma excision    . Cataract extraction  2012    bilateral    Social History  Substance Use Topics  . Smoking status: Never Smoker   . Smokeless tobacco: Never Used  . Alcohol Use: 0.0 oz/week     Comment: 2 mixed drinks/ day    Family History  Problem Relation Age of Onset  . Stomach cancer Mother    . Heart disease Father     Medication list has been reviewed and updated.  Physical Examination:  Physical Exam  Constitutional: She is oriented to person, place, and time. She appears well-developed and well-nourished. No distress.  HENT:  Head: Normocephalic and atraumatic.  Right Ear: Hearing and external ear normal.  Left Ear: Hearing, tympanic membrane, external ear and ear canal normal.  Nose: Nose normal.  Mouth/Throat: Uvula is midline, oropharynx is clear and moist and mucous membranes are normal.  Right ear canal blocked by cerumen. Lavage and removal of wax with alligator forceps partially successful. Adherent piece of wax remaining. Canal extremely erythematous and inflamed. Canal not swollen. Only medial portion of TM visualized but normal. No pain with manipulation of auricle or tragus. Tenderness over skin posterior and superior to auricle on temporal bone. No erythema or swelling. No mastoid tenderness.  Eyes: Conjunctivae and lids are normal. Right eye exhibits no discharge. Left eye exhibits no discharge. No scleral icterus.  Cardiovascular: Normal rate, regular rhythm, normal heart sounds and normal pulses.   No murmur heard. Pulmonary/Chest: Effort normal and breath sounds normal. No respiratory distress.  Musculoskeletal: Normal range of motion.  Lymphadenopathy:       Head (right side): No submental, no submandibular and no tonsillar adenopathy present.  Head (left side): No submental, no submandibular and no tonsillar adenopathy present.    She has no cervical adenopathy.  Neurological: She is alert and oriented to person, place, and time.  Skin: Skin is warm, dry and intact. No lesion and no rash noted.  Psychiatric: She has a normal mood and affect. Her speech is normal and behavior is normal. Thought content normal.   BP 134/66 mmHg  Pulse 64  Temp(Src) 97.9 F (36.6 C) (Oral)  Resp 16  Ht 5\' 3"  (1.6 m)  Wt 159 lb 3.2 oz (72.213 kg)  BMI 28.21  kg/m2  SpO2 97%  Assessment and Plan:  1. Otitis externa, right Right ear canal red and inflamed - will treat with floxin otic x 1 week. TTP superior postauricular region. No swelling or erythema. Low likelihood for mastoiditis as tenderness is more superior. Tylenol for pain. Advised she return to clinic if her symptoms are not starting to improve in 48 hours or sooner if sx worsen. Discussed return precautions. - ofloxacin (FLOXIN) 0.3 % otic solution; Place 10 drops into the right ear daily.  Dispense: 5 mL; Refill: 0   Nicole V. Drenda Freeze, MHS [facility]  07/14/2015

## 2015-07-14 NOTE — Patient Instructions (Signed)
Apply ten drops in right ear once a day for 7 days. Take tylenol for pain. If your symptoms are not starting to improve in 48 hours, let me know. Return at any time if you develop decreased hearing, fever or chills.

## 2015-12-11 DIAGNOSIS — B353 Tinea pedis: Secondary | ICD-10-CM | POA: Diagnosis not present

## 2015-12-11 DIAGNOSIS — B359 Dermatophytosis, unspecified: Secondary | ICD-10-CM | POA: Diagnosis not present

## 2015-12-11 DIAGNOSIS — L728 Other follicular cysts of the skin and subcutaneous tissue: Secondary | ICD-10-CM | POA: Diagnosis not present

## 2015-12-11 DIAGNOSIS — L570 Actinic keratosis: Secondary | ICD-10-CM | POA: Diagnosis not present

## 2016-02-05 DIAGNOSIS — D485 Neoplasm of uncertain behavior of skin: Secondary | ICD-10-CM | POA: Diagnosis not present

## 2016-03-06 DIAGNOSIS — Z1231 Encounter for screening mammogram for malignant neoplasm of breast: Secondary | ICD-10-CM | POA: Diagnosis not present

## 2016-03-07 ENCOUNTER — Other Ambulatory Visit: Payer: Self-pay | Admitting: Ophthalmology

## 2016-03-07 DIAGNOSIS — L821 Other seborrheic keratosis: Secondary | ICD-10-CM | POA: Diagnosis not present

## 2016-03-07 DIAGNOSIS — D485 Neoplasm of uncertain behavior of skin: Secondary | ICD-10-CM | POA: Diagnosis not present

## 2016-04-16 DIAGNOSIS — L821 Other seborrheic keratosis: Secondary | ICD-10-CM | POA: Diagnosis not present

## 2016-04-16 DIAGNOSIS — Z961 Presence of intraocular lens: Secondary | ICD-10-CM | POA: Diagnosis not present

## 2016-06-03 DIAGNOSIS — E784 Other hyperlipidemia: Secondary | ICD-10-CM | POA: Diagnosis not present

## 2016-06-03 DIAGNOSIS — M859 Disorder of bone density and structure, unspecified: Secondary | ICD-10-CM | POA: Diagnosis not present

## 2016-06-03 DIAGNOSIS — I10 Essential (primary) hypertension: Secondary | ICD-10-CM | POA: Diagnosis not present

## 2016-06-11 DIAGNOSIS — Z0000 Encounter for general adult medical examination without abnormal findings: Secondary | ICD-10-CM | POA: Diagnosis not present

## 2016-06-11 DIAGNOSIS — Z6827 Body mass index (BMI) 27.0-27.9, adult: Secondary | ICD-10-CM | POA: Diagnosis not present

## 2016-06-11 DIAGNOSIS — G459 Transient cerebral ischemic attack, unspecified: Secondary | ICD-10-CM | POA: Diagnosis not present

## 2016-06-11 DIAGNOSIS — E784 Other hyperlipidemia: Secondary | ICD-10-CM | POA: Diagnosis not present

## 2016-06-11 DIAGNOSIS — D692 Other nonthrombocytopenic purpura: Secondary | ICD-10-CM | POA: Diagnosis not present

## 2016-06-11 DIAGNOSIS — M1990 Unspecified osteoarthritis, unspecified site: Secondary | ICD-10-CM | POA: Diagnosis not present

## 2016-06-11 DIAGNOSIS — I10 Essential (primary) hypertension: Secondary | ICD-10-CM | POA: Diagnosis not present

## 2016-06-11 DIAGNOSIS — R079 Chest pain, unspecified: Secondary | ICD-10-CM | POA: Diagnosis not present

## 2016-06-11 DIAGNOSIS — M859 Disorder of bone density and structure, unspecified: Secondary | ICD-10-CM | POA: Diagnosis not present

## 2016-06-11 DIAGNOSIS — Z1389 Encounter for screening for other disorder: Secondary | ICD-10-CM | POA: Diagnosis not present

## 2016-06-16 DIAGNOSIS — Z1212 Encounter for screening for malignant neoplasm of rectum: Secondary | ICD-10-CM | POA: Diagnosis not present

## 2016-07-23 DIAGNOSIS — M170 Bilateral primary osteoarthritis of knee: Secondary | ICD-10-CM | POA: Diagnosis not present

## 2016-07-23 DIAGNOSIS — M47812 Spondylosis without myelopathy or radiculopathy, cervical region: Secondary | ICD-10-CM | POA: Diagnosis not present

## 2016-08-16 DIAGNOSIS — Z23 Encounter for immunization: Secondary | ICD-10-CM | POA: Diagnosis not present

## 2016-12-11 DIAGNOSIS — I10 Essential (primary) hypertension: Secondary | ICD-10-CM | POA: Diagnosis not present

## 2016-12-11 DIAGNOSIS — I872 Venous insufficiency (chronic) (peripheral): Secondary | ICD-10-CM | POA: Diagnosis not present

## 2016-12-11 DIAGNOSIS — G459 Transient cerebral ischemic attack, unspecified: Secondary | ICD-10-CM | POA: Diagnosis not present

## 2016-12-11 DIAGNOSIS — E784 Other hyperlipidemia: Secondary | ICD-10-CM | POA: Diagnosis not present

## 2017-01-13 DIAGNOSIS — Z6828 Body mass index (BMI) 28.0-28.9, adult: Secondary | ICD-10-CM | POA: Diagnosis not present

## 2017-01-13 DIAGNOSIS — H6121 Impacted cerumen, right ear: Secondary | ICD-10-CM | POA: Diagnosis not present

## 2017-01-16 DIAGNOSIS — G501 Atypical facial pain: Secondary | ICD-10-CM | POA: Diagnosis not present

## 2017-01-16 DIAGNOSIS — Z6828 Body mass index (BMI) 28.0-28.9, adult: Secondary | ICD-10-CM | POA: Diagnosis not present

## 2017-01-16 DIAGNOSIS — H9202 Otalgia, left ear: Secondary | ICD-10-CM | POA: Diagnosis not present

## 2017-03-09 DIAGNOSIS — Z1231 Encounter for screening mammogram for malignant neoplasm of breast: Secondary | ICD-10-CM | POA: Diagnosis not present

## 2017-03-11 DIAGNOSIS — L814 Other melanin hyperpigmentation: Secondary | ICD-10-CM | POA: Diagnosis not present

## 2017-03-11 DIAGNOSIS — Z85828 Personal history of other malignant neoplasm of skin: Secondary | ICD-10-CM | POA: Diagnosis not present

## 2017-03-11 DIAGNOSIS — L821 Other seborrheic keratosis: Secondary | ICD-10-CM | POA: Diagnosis not present

## 2017-04-15 DIAGNOSIS — L814 Other melanin hyperpigmentation: Secondary | ICD-10-CM | POA: Diagnosis not present

## 2017-04-15 DIAGNOSIS — L570 Actinic keratosis: Secondary | ICD-10-CM | POA: Diagnosis not present

## 2017-04-15 DIAGNOSIS — L821 Other seborrheic keratosis: Secondary | ICD-10-CM | POA: Diagnosis not present

## 2017-05-04 DIAGNOSIS — Z961 Presence of intraocular lens: Secondary | ICD-10-CM | POA: Diagnosis not present

## 2017-05-04 DIAGNOSIS — L821 Other seborrheic keratosis: Secondary | ICD-10-CM | POA: Diagnosis not present

## 2017-05-13 DIAGNOSIS — E663 Overweight: Secondary | ICD-10-CM | POA: Diagnosis not present

## 2017-05-13 DIAGNOSIS — H01003 Unspecified blepharitis right eye, unspecified eyelid: Secondary | ICD-10-CM | POA: Diagnosis not present

## 2017-05-13 DIAGNOSIS — I872 Venous insufficiency (chronic) (peripheral): Secondary | ICD-10-CM | POA: Diagnosis not present

## 2017-05-13 DIAGNOSIS — G501 Atypical facial pain: Secondary | ICD-10-CM | POA: Diagnosis not present

## 2017-06-10 DIAGNOSIS — E784 Other hyperlipidemia: Secondary | ICD-10-CM | POA: Diagnosis not present

## 2017-06-10 DIAGNOSIS — M859 Disorder of bone density and structure, unspecified: Secondary | ICD-10-CM | POA: Diagnosis not present

## 2017-06-10 DIAGNOSIS — I10 Essential (primary) hypertension: Secondary | ICD-10-CM | POA: Diagnosis not present

## 2017-06-18 DIAGNOSIS — I10 Essential (primary) hypertension: Secondary | ICD-10-CM | POA: Diagnosis not present

## 2017-06-18 DIAGNOSIS — M1990 Unspecified osteoarthritis, unspecified site: Secondary | ICD-10-CM | POA: Diagnosis not present

## 2017-06-18 DIAGNOSIS — E784 Other hyperlipidemia: Secondary | ICD-10-CM | POA: Diagnosis not present

## 2017-06-18 DIAGNOSIS — Z0000 Encounter for general adult medical examination without abnormal findings: Secondary | ICD-10-CM | POA: Diagnosis not present

## 2017-06-19 DIAGNOSIS — Z1212 Encounter for screening for malignant neoplasm of rectum: Secondary | ICD-10-CM | POA: Diagnosis not present

## 2017-08-04 ENCOUNTER — Other Ambulatory Visit: Payer: Self-pay | Admitting: Dermatology

## 2017-08-04 DIAGNOSIS — L570 Actinic keratosis: Secondary | ICD-10-CM | POA: Diagnosis not present

## 2017-08-04 DIAGNOSIS — L309 Dermatitis, unspecified: Secondary | ICD-10-CM | POA: Diagnosis not present

## 2017-08-04 DIAGNOSIS — C44311 Basal cell carcinoma of skin of nose: Secondary | ICD-10-CM | POA: Diagnosis not present

## 2017-08-04 DIAGNOSIS — C44622 Squamous cell carcinoma of skin of right upper limb, including shoulder: Secondary | ICD-10-CM | POA: Diagnosis not present

## 2017-08-04 DIAGNOSIS — C4491 Basal cell carcinoma of skin, unspecified: Secondary | ICD-10-CM

## 2017-08-04 HISTORY — DX: Basal cell carcinoma of skin, unspecified: C44.91

## 2017-08-15 DIAGNOSIS — Z23 Encounter for immunization: Secondary | ICD-10-CM | POA: Diagnosis not present

## 2017-08-27 DIAGNOSIS — C44622 Squamous cell carcinoma of skin of right upper limb, including shoulder: Secondary | ICD-10-CM | POA: Diagnosis not present

## 2017-10-09 DIAGNOSIS — K644 Residual hemorrhoidal skin tags: Secondary | ICD-10-CM | POA: Diagnosis not present

## 2017-10-09 DIAGNOSIS — R319 Hematuria, unspecified: Secondary | ICD-10-CM | POA: Diagnosis not present

## 2017-10-09 DIAGNOSIS — K921 Melena: Secondary | ICD-10-CM | POA: Diagnosis not present

## 2017-10-09 DIAGNOSIS — K625 Hemorrhage of anus and rectum: Secondary | ICD-10-CM | POA: Diagnosis not present

## 2017-11-24 DIAGNOSIS — C44311 Basal cell carcinoma of skin of nose: Secondary | ICD-10-CM | POA: Diagnosis not present

## 2017-12-10 DIAGNOSIS — M1990 Unspecified osteoarthritis, unspecified site: Secondary | ICD-10-CM | POA: Diagnosis not present

## 2017-12-10 DIAGNOSIS — E7849 Other hyperlipidemia: Secondary | ICD-10-CM | POA: Diagnosis not present

## 2017-12-10 DIAGNOSIS — I872 Venous insufficiency (chronic) (peripheral): Secondary | ICD-10-CM | POA: Diagnosis not present

## 2017-12-10 DIAGNOSIS — I10 Essential (primary) hypertension: Secondary | ICD-10-CM | POA: Diagnosis not present

## 2017-12-15 DIAGNOSIS — C44311 Basal cell carcinoma of skin of nose: Secondary | ICD-10-CM | POA: Diagnosis not present

## 2018-03-07 ENCOUNTER — Encounter: Payer: Self-pay | Admitting: Gastroenterology

## 2018-03-10 DIAGNOSIS — Z1231 Encounter for screening mammogram for malignant neoplasm of breast: Secondary | ICD-10-CM | POA: Diagnosis not present

## 2018-05-05 DIAGNOSIS — Z961 Presence of intraocular lens: Secondary | ICD-10-CM | POA: Diagnosis not present

## 2018-05-05 DIAGNOSIS — L821 Other seborrheic keratosis: Secondary | ICD-10-CM | POA: Diagnosis not present

## 2018-06-21 DIAGNOSIS — E7849 Other hyperlipidemia: Secondary | ICD-10-CM | POA: Diagnosis not present

## 2018-06-21 DIAGNOSIS — I10 Essential (primary) hypertension: Secondary | ICD-10-CM | POA: Diagnosis not present

## 2018-06-21 DIAGNOSIS — M859 Disorder of bone density and structure, unspecified: Secondary | ICD-10-CM | POA: Diagnosis not present

## 2018-06-21 DIAGNOSIS — R82998 Other abnormal findings in urine: Secondary | ICD-10-CM | POA: Diagnosis not present

## 2018-06-28 DIAGNOSIS — E7849 Other hyperlipidemia: Secondary | ICD-10-CM | POA: Diagnosis not present

## 2018-06-28 DIAGNOSIS — M859 Disorder of bone density and structure, unspecified: Secondary | ICD-10-CM | POA: Diagnosis not present

## 2018-06-28 DIAGNOSIS — Z0000 Encounter for general adult medical examination without abnormal findings: Secondary | ICD-10-CM | POA: Diagnosis not present

## 2018-06-28 DIAGNOSIS — Z23 Encounter for immunization: Secondary | ICD-10-CM | POA: Diagnosis not present

## 2018-06-28 DIAGNOSIS — I10 Essential (primary) hypertension: Secondary | ICD-10-CM | POA: Diagnosis not present

## 2018-07-06 DIAGNOSIS — Z1212 Encounter for screening for malignant neoplasm of rectum: Secondary | ICD-10-CM | POA: Diagnosis not present

## 2018-08-23 DIAGNOSIS — D229 Melanocytic nevi, unspecified: Secondary | ICD-10-CM | POA: Diagnosis not present

## 2018-08-23 DIAGNOSIS — I780 Hereditary hemorrhagic telangiectasia: Secondary | ICD-10-CM | POA: Diagnosis not present

## 2018-08-23 DIAGNOSIS — L570 Actinic keratosis: Secondary | ICD-10-CM | POA: Diagnosis not present

## 2018-12-27 DIAGNOSIS — M1990 Unspecified osteoarthritis, unspecified site: Secondary | ICD-10-CM | POA: Diagnosis not present

## 2018-12-27 DIAGNOSIS — J449 Chronic obstructive pulmonary disease, unspecified: Secondary | ICD-10-CM | POA: Diagnosis not present

## 2018-12-27 DIAGNOSIS — I10 Essential (primary) hypertension: Secondary | ICD-10-CM | POA: Diagnosis not present

## 2018-12-27 DIAGNOSIS — E7849 Other hyperlipidemia: Secondary | ICD-10-CM | POA: Diagnosis not present

## 2019-01-03 DIAGNOSIS — M7532 Calcific tendinitis of left shoulder: Secondary | ICD-10-CM | POA: Diagnosis not present

## 2019-04-23 DIAGNOSIS — Z1231 Encounter for screening mammogram for malignant neoplasm of breast: Secondary | ICD-10-CM | POA: Diagnosis not present

## 2019-05-18 DIAGNOSIS — Z961 Presence of intraocular lens: Secondary | ICD-10-CM | POA: Diagnosis not present

## 2019-05-18 DIAGNOSIS — H524 Presbyopia: Secondary | ICD-10-CM | POA: Diagnosis not present

## 2019-05-18 DIAGNOSIS — H353131 Nonexudative age-related macular degeneration, bilateral, early dry stage: Secondary | ICD-10-CM | POA: Diagnosis not present

## 2019-05-18 DIAGNOSIS — H52203 Unspecified astigmatism, bilateral: Secondary | ICD-10-CM | POA: Diagnosis not present

## 2019-06-27 DIAGNOSIS — E7849 Other hyperlipidemia: Secondary | ICD-10-CM | POA: Diagnosis not present

## 2019-06-27 DIAGNOSIS — M859 Disorder of bone density and structure, unspecified: Secondary | ICD-10-CM | POA: Diagnosis not present

## 2019-06-30 DIAGNOSIS — Z23 Encounter for immunization: Secondary | ICD-10-CM | POA: Diagnosis not present

## 2019-06-30 DIAGNOSIS — R82998 Other abnormal findings in urine: Secondary | ICD-10-CM | POA: Diagnosis not present

## 2019-07-04 DIAGNOSIS — Z0000 Encounter for general adult medical examination without abnormal findings: Secondary | ICD-10-CM | POA: Diagnosis not present

## 2019-07-04 DIAGNOSIS — M1990 Unspecified osteoarthritis, unspecified site: Secondary | ICD-10-CM | POA: Diagnosis not present

## 2019-07-04 DIAGNOSIS — E785 Hyperlipidemia, unspecified: Secondary | ICD-10-CM | POA: Diagnosis not present

## 2019-07-04 DIAGNOSIS — I10 Essential (primary) hypertension: Secondary | ICD-10-CM | POA: Diagnosis not present

## 2019-07-25 DIAGNOSIS — Z1212 Encounter for screening for malignant neoplasm of rectum: Secondary | ICD-10-CM | POA: Diagnosis not present

## 2019-11-30 DIAGNOSIS — Z961 Presence of intraocular lens: Secondary | ICD-10-CM | POA: Diagnosis not present

## 2019-11-30 DIAGNOSIS — H353131 Nonexudative age-related macular degeneration, bilateral, early dry stage: Secondary | ICD-10-CM | POA: Diagnosis not present

## 2019-12-28 DIAGNOSIS — E785 Hyperlipidemia, unspecified: Secondary | ICD-10-CM | POA: Diagnosis not present

## 2019-12-28 DIAGNOSIS — I10 Essential (primary) hypertension: Secondary | ICD-10-CM | POA: Diagnosis not present

## 2019-12-28 DIAGNOSIS — J449 Chronic obstructive pulmonary disease, unspecified: Secondary | ICD-10-CM | POA: Diagnosis not present

## 2019-12-28 DIAGNOSIS — M1990 Unspecified osteoarthritis, unspecified site: Secondary | ICD-10-CM | POA: Diagnosis not present

## 2020-04-18 ENCOUNTER — Emergency Department (HOSPITAL_COMMUNITY): Payer: Medicare Other

## 2020-04-18 ENCOUNTER — Other Ambulatory Visit: Payer: Self-pay

## 2020-04-18 ENCOUNTER — Emergency Department (HOSPITAL_COMMUNITY)
Admission: EM | Admit: 2020-04-18 | Discharge: 2020-04-19 | Disposition: A | Discharge status: Home / Self Care | Payer: Medicare Other | Attending: Emergency Medicine | Admitting: Emergency Medicine

## 2020-04-18 ENCOUNTER — Encounter (HOSPITAL_COMMUNITY): Payer: Self-pay

## 2020-04-18 DIAGNOSIS — I160 Hypertensive urgency: Secondary | ICD-10-CM | POA: Diagnosis not present

## 2020-04-18 DIAGNOSIS — Z20822 Contact with and (suspected) exposure to covid-19: Secondary | ICD-10-CM | POA: Insufficient documentation

## 2020-04-18 DIAGNOSIS — R4701 Aphasia: Secondary | ICD-10-CM | POA: Diagnosis not present

## 2020-04-18 DIAGNOSIS — R42 Dizziness and giddiness: Secondary | ICD-10-CM | POA: Diagnosis not present

## 2020-04-18 DIAGNOSIS — G459 Transient cerebral ischemic attack, unspecified: Secondary | ICD-10-CM | POA: Diagnosis not present

## 2020-04-18 DIAGNOSIS — G4489 Other headache syndrome: Secondary | ICD-10-CM | POA: Diagnosis not present

## 2020-04-18 DIAGNOSIS — R29810 Facial weakness: Secondary | ICD-10-CM | POA: Diagnosis not present

## 2020-04-18 DIAGNOSIS — I6523 Occlusion and stenosis of bilateral carotid arteries: Secondary | ICD-10-CM | POA: Diagnosis not present

## 2020-04-18 DIAGNOSIS — Z03818 Encounter for observation for suspected exposure to other biological agents ruled out: Secondary | ICD-10-CM | POA: Diagnosis not present

## 2020-04-18 DIAGNOSIS — I161 Hypertensive emergency: Secondary | ICD-10-CM | POA: Insufficient documentation

## 2020-04-18 DIAGNOSIS — Z85828 Personal history of other malignant neoplasm of skin: Secondary | ICD-10-CM | POA: Diagnosis not present

## 2020-04-18 DIAGNOSIS — R7989 Other specified abnormal findings of blood chemistry: Secondary | ICD-10-CM | POA: Diagnosis not present

## 2020-04-18 DIAGNOSIS — Z7982 Long term (current) use of aspirin: Secondary | ICD-10-CM | POA: Diagnosis not present

## 2020-04-18 DIAGNOSIS — Z79899 Other long term (current) drug therapy: Secondary | ICD-10-CM | POA: Insufficient documentation

## 2020-04-18 DIAGNOSIS — R519 Headache, unspecified: Secondary | ICD-10-CM | POA: Diagnosis not present

## 2020-04-18 DIAGNOSIS — R4781 Slurred speech: Secondary | ICD-10-CM | POA: Diagnosis not present

## 2020-04-18 DIAGNOSIS — I10 Essential (primary) hypertension: Secondary | ICD-10-CM | POA: Diagnosis not present

## 2020-04-18 DIAGNOSIS — H539 Unspecified visual disturbance: Secondary | ICD-10-CM | POA: Diagnosis not present

## 2020-04-18 LAB — PROTIME-INR
INR: 1 (ref 0.8–1.2)
Prothrombin Time: 13 seconds (ref 11.4–15.2)

## 2020-04-18 LAB — COMPREHENSIVE METABOLIC PANEL
ALT: 12 U/L (ref 0–44)
AST: 19 U/L (ref 15–41)
Albumin: 3.7 g/dL (ref 3.5–5.0)
Alkaline Phosphatase: 64 U/L (ref 38–126)
Anion gap: 11 (ref 5–15)
BUN: 18 mg/dL (ref 8–23)
CO2: 26 mmol/L (ref 22–32)
Calcium: 9.2 mg/dL (ref 8.9–10.3)
Chloride: 98 mmol/L (ref 98–111)
Creatinine, Ser: 0.93 mg/dL (ref 0.44–1.00)
GFR calc Af Amer: >60 mL/min (ref >60)
GFR calc non Af Amer: 54 mL/min — ABNORMAL LOW (ref >60)
Glucose, Bld: 99 mg/dL (ref 70–99)
Potassium: 4.2 mmol/L (ref 3.5–5.1)
Sodium: 135 mmol/L (ref 135–145)
Total Bilirubin: 1.3 mg/dL — ABNORMAL HIGH (ref 0.3–1.2)
Total Protein: 7.1 g/dL (ref 6.5–8.1)

## 2020-04-18 LAB — DIFFERENTIAL
Abs Immature Granulocytes: 0.02 10*3/uL (ref 0.00–0.07)
Basophils Absolute: 0.1 10*3/uL (ref 0.0–0.1)
Basophils Relative: 1 %
Eosinophils Absolute: 0.3 10*3/uL (ref 0.0–0.5)
Eosinophils Relative: 3 %
Immature Granulocytes: 0 %
Lymphocytes Relative: 26 %
Lymphs Abs: 2.1 10*3/uL (ref 0.7–4.0)
Monocytes Absolute: 1.3 10*3/uL — ABNORMAL HIGH (ref 0.1–1.0)
Monocytes Relative: 16 %
Neutro Abs: 4.5 10*3/uL (ref 1.7–7.7)
Neutrophils Relative %: 54 %

## 2020-04-18 LAB — URINALYSIS, ROUTINE W REFLEX MICROSCOPIC
Bilirubin Urine: NEGATIVE
Glucose, UA: NEGATIVE mg/dL
Hgb urine dipstick: NEGATIVE
Ketones, ur: 5 mg/dL — AB
Leukocytes,Ua: NEGATIVE
Nitrite: NEGATIVE
Protein, ur: NEGATIVE mg/dL
Specific Gravity, Urine: 1.011 (ref 1.005–1.030)
pH: 7 (ref 5.0–8.0)

## 2020-04-18 LAB — RAPID URINE DRUG SCREEN, HOSP PERFORMED
Amphetamines: NOT DETECTED
Barbiturates: NOT DETECTED
Benzodiazepines: NOT DETECTED
Cocaine: NOT DETECTED
Opiates: NOT DETECTED
Tetrahydrocannabinol: NOT DETECTED

## 2020-04-18 LAB — CBC
HCT: 40.6 % (ref 36.0–46.0)
Hemoglobin: 13.6 g/dL (ref 12.0–15.0)
MCH: 29.2 pg (ref 26.0–34.0)
MCHC: 33.5 g/dL (ref 30.0–36.0)
MCV: 87.3 fL (ref 80.0–100.0)
Platelets: 195 10*3/uL (ref 150–400)
RBC: 4.65 MIL/uL (ref 3.87–5.11)
RDW: 14 % (ref 11.5–15.5)
WBC: 8.2 10*3/uL (ref 4.0–10.5)
nRBC: 0 % (ref 0.0–0.2)

## 2020-04-18 LAB — APTT: aPTT: 38 seconds — ABNORMAL HIGH (ref 24–36)

## 2020-04-18 LAB — ETHANOL: Alcohol, Ethyl (B): <10 mg/dL (ref <10)

## 2020-04-18 LAB — SARS CORONAVIRUS 2 BY RT PCR (HOSPITAL ORDER, PERFORMED IN ~~LOC~~ HOSPITAL LAB): SARS Coronavirus 2: NEGATIVE

## 2020-04-18 MED ORDER — IOHEXOL 350 MG/ML SOLN
80.0000 mL | Freq: Once | INTRAVENOUS | Status: AC | PRN
  Administered 2020-04-18: 80 mL via INTRAVENOUS

## 2020-04-18 MED ORDER — NICARDIPINE HCL IN NACL 20-0.86 MG/200ML-% IV SOLN
3.0000 mg/h | INTRAVENOUS | Status: DC
  Administered 2020-04-19: 5 mg/h via INTRAVENOUS
  Filled 2020-04-18: qty 200

## 2020-04-18 MED ORDER — LABETALOL HCL 5 MG/ML IV SOLN: 0.5000 mg/min | INTRAVENOUS | Status: DC

## 2020-04-18 NOTE — ED Provider Notes (Signed)
[facility] EMERGENCY DEPARTMENT Provider Note   CSN: [id number] Arrival date & time: 04/18/20  1329     History No chief complaint on file.   Pam Mejia is a 86 y.o. female.  Patient is an 86 year old female with a history of hypertension, hyperlipidemia, syncope and prior TIAs who is presenting today with intermittent symptoms of aphasia and does complain of some mild dizziness which she describes as things moving in her visual field.  Patient reports this week she has had several episodes of aphasia.  Several days ago she reports she was doing a crossword puzzle when she suddenly could not understand what she was reading and could not get the words out correctly.  This resolved after a short time in the she had several episodes throughout the week the most recent episode was this morning similar event with unable to get the words out but also reported she had some dizziness today.  She currently denies any issues with speech and has no unilateral weakness, numbness, facial symptoms or difficulty swallowing.  She denies headache, recent trauma and has been taking the same medications for quite some time.  She went to her doctor's office today and they sent her here for further evaluation.  She denies any recent illness, cough, congestion, fever or shortness of breath.  The history is provided by the patient.       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   Left Eyebrow - SCC in Situ   Cancer ([place])    melanoma   Depression    Diverticulosis    Elevated CK    likely secondary to trauma   Glaucoma    HTN (hypertension)    Hyperlipidemia    Migraine    Osteoarthritis    SCC (squamous cell carcinoma) Keratoacanthoma 12/11/2014   Left Jawline   SCC (squamous cell carcinoma) Keratoacanthoma 08/04/2017   Right Hand   Squamous cell carcinoma in situ (SCCIS) 03/16/2001   Left Collarbone   Stroke ([place])    pt states she has had TIA's   Superficial basal cell carcinoma (BCC) w/Adnexal Differentiation 08/29/2009   Right Frontal Hairline   Superficial nodular basal cell carcinoma (BCC) 08/04/2017   Right Nose   Syncope    UTI (urinary tract infection)     Patient Active Problem List   Diagnosis Date Noted   EDEMA 03/12/2010   DYSPNEA 03/12/2010   HYPERLIPIDEMIA-MIXED 02/06/2010   HYPERTENSION, UNSPECIFIED 02/06/2010   CHEST PAIN-UNSPECIFIED 02/06/2010    Past Surgical History:  Procedure Laterality Date   BASAL CELL CARCINOMA EXCISION     CATARACT EXTRACTION  2012   bilateral   SQUAMOUS CELL CARCINOMA EXCISION       OB History   No obstetric history on file.  Family History  Problem Relation Age of Onset   Stomach cancer Mother    Heart disease Father     Social History   Tobacco Use   Smoking status: Never Smoker   Smokeless tobacco: Never Used  Substance Use Topics   Alcohol use: Yes    Comment: 2 mixed drinks/ day   Drug use: No    Home Medications Prior to Admission medications   Medication Sig Start Date End Date Taking? Authorizing Provider  aspirin 81 MG tablet Take 81 mg by mouth daily.      [provider]  diclofenac sodium (VOLTAREN) 1 % GEL Apply 1 application topically as needed.    [provider]  Naproxen Sodium (ALEVE) 220 MG CAPS Take 1 capsule by mouth as needed.     [provider]  triamterene-hydrochlorothiazide (MAXZIDE-25) 37.5-25 MG per tablet Take 1 tablet by mouth daily.    [provider]    Allergies    Patient has no known allergies.  Review of Systems   Review of Systems  All other systems reviewed and are negative.   Physical Exam Updated Vital Signs BP (!) 213/87 (BP Location: Right Arm)    Pulse (!) 59    Temp 98.2 F (36.8 C) (Oral)    Resp 16    Ht 5\' 4"  (1.626 m)    Wt 79.4 kg    SpO2 100%    BMI 30.04 kg/m   Physical Exam Vitals and nursing note reviewed.  Constitutional:      General: She is not in acute distress.    Appearance: Normal appearance. She is well-developed and normal weight.  HENT:     Head: Normocephalic and atraumatic.  Eyes:     General: No visual field deficit.    Pupils: Pupils are equal, round, and reactive to light.     Comments: When covering each eye individually she states it still seems like things are moving in her visual field  Cardiovascular:     Rate and Rhythm: Normal rate and regular rhythm.     Heart sounds: Normal heart sounds. No murmur heard.  No friction rub.  Pulmonary:     Effort: Pulmonary effort is normal.     Breath sounds: Normal breath sounds. No wheezing or rales.  Abdominal:     General: Bowel sounds are normal. There is no distension.     Palpations: Abdomen is soft.     Tenderness: There is no abdominal tenderness. There is no guarding or rebound.  Musculoskeletal:        General: No tenderness. Normal range of motion.     Comments: No edema  Skin:    General: Skin is warm and dry.     Findings: No rash.  Neurological:     Mental Status: She is alert and oriented to person, place, and time.     Cranial Nerves: No cranial nerve deficit, dysarthria or facial asymmetry.     Sensory: No sensory deficit.     Motor: Motor function is intact. No weakness or pronator drift.     Coordination: Coordination is intact. Coordination normal. Finger-Nose-Finger  Test and Heel to [facility] Test normal.  Psychiatric:        Mood and Affect: Mood normal.        Behavior: Behavior normal.        Thought Content: Thought content normal.     ED Results / Procedures / Treatments   Labs (all labs ordered are  listed, but only abnormal results are displayed) Labs Reviewed  APTT - Abnormal; Notable for the following components:      Result Value   aPTT 38 (*)    All other components within normal limits  DIFFERENTIAL - Abnormal; Notable for the following components:   Monocytes Absolute 1.3 (*)    All other components within normal limits  COMPREHENSIVE METABOLIC PANEL - Abnormal; Notable for the following components:   Total Bilirubin 1.3 (*)    GFR calc non Af Amer 54 (*)    All other components within normal limits  ETHANOL  PROTIME-INR  CBC  RAPID URINE DRUG SCREEN, HOSP PERFORMED  URINALYSIS, ROUTINE W REFLEX MICROSCOPIC    EKG EKG Interpretation  Date/Time:  Wednesday April 18 2020 13:42:23 EDT Ventricular Rate:  57 PR Interval:    QRS Duration: 97 QT Interval:  444 QTC Calculation: 433 R Axis:     Text Interpretation: Sinus rhythm Borderline prolonged PR interval Low voltage, precordial leads No previous tracing Confirmed by Blanchie Dessert [id number]) on 04/18/2020 1:57:24 PM   Radiology CT HEAD WO CONTRAST  Result Date: 04/18/2020 CLINICAL DATA:  Focal neurologic defect, hypertensive episode, multiple recent TIA EXAM: CT HEAD WITHOUT CONTRAST TECHNIQUE: Contiguous axial images were obtained from the base of the skull through the vertex without intravenous contrast. COMPARISON:  None. FINDINGS: Brain: No evidence of acute infarction, hemorrhage, hydrocephalus, extra-axial collection or mass lesion/mass effect. Symmetric prominence of the ventricles, cisterns and sulci compatible with moderate parenchymal volume loss. Patchy areas of white matter hypoattenuation are most compatible with chronic microvascular angiopathy. Vascular:  Atherosclerotic calcification of the carotid siphons and intradural vertebral arteries. No hyperdense vessel. Skull: No calvarial fracture or suspicious osseous lesion. No scalp swelling or hematoma. Sinuses/Orbits: Paranasal sinuses and mastoid air cells are predominantly clear. Orbital structures are unremarkable aside from prior lens extractions and senescent scleral plaques. Other: None IMPRESSION: 1. No acute intracranial findings. If persisting clinical concern for acute infarct, MRI is more sensitive and specific for early changes of ischemia. 2. Moderate parenchymal volume loss and chronic microvascular angiopathy. Electronically Signed   By: Lovena Le M.D.   On: 04/18/2020 16:43    Procedures Procedures (including critical care time)  Medications Ordered in ED Medications - No data to display  ED Course  I have reviewed the triage vital signs and the nursing notes.  Pertinent labs & imaging results that were available during my care of the patient were reviewed by me and considered in my medical decision making (see chart for details).    MDM Rules/Calculators/A&P                          Elderly female presenting today with symptoms concerning for TIA with intermittent episodes this week of aphasia and also reports a dizziness in her visual field.  Patient is in no acute distress today but is hypertensive which she states is abnormal.  Low suspicion for infection she denies any trauma and low suspicion for intracranial hemorrhage but will do TIA work-up including blood work and head CT.  Patient's labs without acute findings.  Imaging is pending.  EKG with sinus rhythm.  Labs and imaging without acute findings however pt symptoms are concerning and needs TIA work up.  MDM Number of Diagnoses or Management Options   Amount and/or Complexity of Data Reviewed Clinical lab tests: ordered and reviewed Tests in the radiology section of CPT: ordered and reviewed Tests in  the medicine  section of CPT: ordered and reviewed Decide to obtain previous medical records or to obtain history from someone other than the patient: yes Obtain history from someone other than the patient: no Review and summarize past medical records: yes Independent visualization of images, tracings, or specimens: yes  Risk of Complications, Morbidity, and/or Mortality Presenting problems: moderate Diagnostic procedures: low Management options: low  Patient Progress Patient progress: stable   Final Clinical Impression(s) / ED Diagnoses Final diagnoses:  TIA (transient ischemic attack)    Rx / DC Orders ED Discharge Orders    None       Blanchie Dessert, MD 04/18/20 1712

## 2020-04-18 NOTE — ED Provider Notes (Signed)
5:05 PM-checkout from Dr. Maryan Rued to evaluate after CT imaging and consider MRI, evaluating for TIA if needed.  8:40 PM-MRI negative for acute abnormality.  8:50 PM-she is alert, cooperative.  No dysarthria or aphasia.  No pronator drift.  No ataxia.  She is alert lucid and conversant.  Blood pressure remains elevated, 200/75.  Blood pressure has trended high here today.  She takes triamterene for hypertension at home.  Apparently earlier when she saw her PCP in the office her "Romberg was positive."  Patient relates having similar episodes of "TIA in the past.  She describes 3 episodes over the last 2 weeks that each occurred with bilateral blurred vision, and a sensation of difficulty to read words on a page, then attempted to say them and they "came out garbled."  Patient with hypertension, here today, substantially higher than in the past when she was normotensive on previous evaluations, several years ago.  2140-case discussed with neuro hospitalist relative to differential diagnosis, additional evaluation and treatment.  Agreed to perform further imaging with CT angio head and neck, to evaluate for critical stenosis.  If there is no significant stenosis, patient can be treated for hypertensive urgency.  If there is critical stenosis, she will need neurology evaluation and possible intervention.  Anticipate hospitalization.  We will do Covid testing.  We will began treatment for hypertensive urgency with permissive mild hypertension, IV nicardipine, to keep blood pressure less than 200 but greater than 384 systolic.  Medications  nicardipine (CARDENE) 20mg  in 0.86% saline 238ml IV infusion (0.1 mg/ml) (has no administration in time range)  iohexol (OMNIPAQUE) 350 MG/ML injection 80 mL (80 mLs Intravenous Contrast Given 04/18/20 2239)      Patient Vitals for the past 24 hrs:  BP Temp Temp src Pulse Resp SpO2 Height Weight  04/18/20 2336 (!) 189/69 -- -- (!) 53 -- -- -- --  04/18/20 2200 (!)  173/72 97.8 F (36.6 C) -- 60 16 97 % -- --  04/18/20 2130 (!) 173/72 -- -- -- -- -- -- --  04/18/20 2100 (!) 182/75 -- -- -- -- -- -- --  04/18/20 2038 (!) 163/101 97.8 F (36.6 C) Oral (!) 57 18 97 % -- --  04/18/20 1345 -- -- -- -- -- -- 5\' 4"  (1.626 m) 79.4 kg  04/18/20 1344 (!) 213/87 98.2 F (36.8 C) Oral (!) 59 16 100 % -- --    12:02 AM Reevaluation with update and discussion. After initial assessment and treatment, an updated evaluation reveals patient remained stable with mild blood pressure elevation.  Nursing is informed to aggressively treat blood pressure with nicardipine drip.Daleen Bo   Medical Decision Making:  This patient is presenting for evaluation of aphasia, which does require a range of treatment options, and is a complaint that involves a high risk of morbidity and mortality. The differential diagnoses include stroke, TIA, hypertensive urgency. I decided to review old records, and in summary elderly female with mild hypertension presenting with aphasia, intermittently for several weeks.  I do not require additional historical information from anyone.  Clinical Laboratory Tests Ordered, included CBC, Metabolic panel, Urinalysis and Urine drug screen. Review indicates no significant abnormalities.. Radiologic Tests Ordered, included CT head, MRI brain, CT angiogram head and neck.  I independently Visualized: CT and MRI images, which show no CVA, or large vessel occlusion  Cardiac Monitor Tracing which shows sinus bradycardia   Critical Interventions-clinical evaluation, laboratory testing, CT imaging, MRI imaging, repeat CT imaging with angiogram; observation,  reassessment  After These Interventions, the Patient was reevaluated and was found to require hospitalization for further care and treatment.  .Critical Care Performed by: Daleen Bo, MD Authorized by: Daleen Bo, MD   Critical care provider statement:    Critical care time (minutes):   35   Critical care start time:  04/18/2020 5:10 PM   Critical care end time:  04/18/2020 10:46 PM   Critical care time was exclusive of:  Separately billable procedures and treating other patients   Critical care was necessary to treat or prevent imminent or life-threatening deterioration of the following conditions:  CNS failure or compromise   Critical care was time spent personally by me on the following activities:  Blood draw for specimens, development of treatment plan with patient or surrogate, discussions with consultants, evaluation of patient's response to treatment, examination of patient, obtaining history from patient or surrogate, ordering and performing treatments and interventions, ordering and review of laboratory studies, pulse oximetry, re-evaluation of patient's condition, review of old charts and ordering and review of radiographic studies     CRITICAL CARE-yes Performed by: Daleen Bo  Nursing Notes Reviewed/ Care Coordinated Applicable Imaging Reviewed Interpretation of Laboratory Data incorporated into ED treatment   Case discussed with critical care who accepts patient for admission, we will manage nicardipine drip.    Daleen Bo, MD 04/19/20 [phone]

## 2020-04-19 DIAGNOSIS — G459 Transient cerebral ischemic attack, unspecified: Secondary | ICD-10-CM

## 2020-04-19 DIAGNOSIS — I10 Essential (primary) hypertension: Secondary | ICD-10-CM

## 2020-04-19 LAB — TROPONIN I (HIGH SENSITIVITY)
Troponin I (High Sensitivity): 110 ng/L (ref <18)
Troponin I (High Sensitivity): 111 ng/L (ref <18)

## 2020-04-19 NOTE — H&P (Deleted)
NAME:  Pam Mejia, MRN:  [id number], DOB:  11/11/1928, LOS: 0 ADMISSION DATE:  04/18/2020, CONSULTATION DATE:  04/19/20 REFERRING MD:  Maryan Rued, CHIEF COMPLAINT:  aphasia  Brief History    Pam Mejia came to the ER today because she was concerned about intermittent aphasia episodes.  Noted to have severe hypertension on arrival to ED.   History of present illness   Intermittent Aphasia - about three episodes over the past week.  Expressive aphasia episodes, last about 5 min.  Last one occurred this AM, resolved on own, after about 5 min.  She is not the greatest historian but she thinks that these are similar to episodes of TIA she had many years ago.  Visual field abnormalities - she notes squiggly lines and circles that occur with the episodes of aphasia (she thinks they happen at the same time.  She says she also had a headache this morning, which went away " a while ago" but she's not sure exactly when.   Currently is completely asymptomatic.    Arrived at 1pm (?) IN ED Nicardipine started about midnight.  Past Medical History  HTN HLD Syncope TIAs  bcc skin, melanoma,  Migraines  Meds; asa 81, naproxen, triamterene/hctz (37.5/25mg ) Significant Hospital Events     Consults:    Procedures:    Significant Diagnostic Tests:  CT head non contrast: 1. No acute intracranial findings. If persisting clinical concern for acute infarct, MRI is more sensitive and specific for early changes of ischemia. 2. Moderate parenchymal volume loss and chronic microvascular Angiopathy.  CT angio head and neck  IMPRESSION: 1. Negative CTA for emergent large vessel occlusion. 2. Mild for age atheromatous change about the carotid bifurcations and carotid siphons without hemodynamically significant stenosis. 3. Short-segment 40-50% stenosis at the origin of the right external carotid artery. 4. Diffuse tortuosity of the major arterial vasculature of the head and neck, suggesting  chronic underlying hypertension.  MRI 1. No acute intracranial abnormality. 2. Mild chronic small vessel ischemic disease and moderate cerebral atrophy.   Micro Data:    Antimicrobials:    Interim history/subjective:    Objective   Blood pressure (!) 189/69, pulse (!) 53, temperature 97.8 F (36.6 C), resp. rate 16, height 5\' 4"  (1.626 m), weight 79.4 kg, SpO2 97 %.       No intake or output data in the 24 hours ending 04/19/20 0014 Filed Weights   04/18/20 1345  Weight: 79.4 kg    Examination: General: NAD pleasant HENT: NCAT, MMM Lungs: CTAB Cardiovascular: no mgr, slight bradycardia Abdomen: Nt, nd, nbs Extremities: tenderness to shins (Chronic per pt), compressive knee stockings, mild non pitting edema in LE Neuro: a and Ox 3, non focal, 5/5 strength in upper and lower extremities, PERRL, eomi, cn intact   Resolved Hospital Problem list     Assessment & Plan:  Hypertension: asymptomatic.   On my exam BP 140 on low dose nicardipine, which we held.  I do not see that any other po or IV drugs were tried.  No need for infusion of BP meds, could be treated with po or even IV push medications on telemetry floor if preferred.   BP was actually coming down prior to starting any medications, if my review of the records is correct.    Bradycardic - asymptomatic.  Just monitor.  Avoid beta blockers  DOE: chronic.  OP workup would be adequate.   TIA symptoms: Imaging negative for stroke.   Carotid stenosis workup  should be done.   No indication for ICU admission at this time.  Thank you.   Best practice:   Labs   CBC: Recent Labs  Lab 04/18/20 1407  WBC 8.2  NEUTROABS 4.5  HGB 13.6  HCT 40.6  MCV 87.3  PLT 782    Basic Metabolic Panel: Recent Labs  Lab 04/18/20 1407  NA 135  K 4.2  CL 98  CO2 26  GLUCOSE 99  BUN 18  CREATININE 0.93  CALCIUM 9.2   GFR: Estimated Creatinine Clearance: 41.8 mL/min (by C-G formula based on SCr of 0.93  mg/dL). Recent Labs  Lab 04/18/20 1407  WBC 8.2    Liver Function Tests: Recent Labs  Lab 04/18/20 1407  AST 19  ALT 12  ALKPHOS 64  BILITOT 1.3*  PROT 7.1  ALBUMIN 3.7   No results for input(s): LIPASE, AMYLASE in the last 168 hours. No results for input(s): AMMONIA in the last 168 hours.  ABG No results found for: PHART, PCO2ART, PO2ART, HCO3, TCO2, ACIDBASEDEF, O2SAT   Coagulation Profile: Recent Labs  Lab 04/18/20 1407  INR 1.0    Cardiac Enzymes: No results for input(s): CKTOTAL, CKMB, CKMBINDEX, TROPONINI in the last 168 hours.  HbA1C: No results found for: HGBA1C  CBG: No results for input(s): GLUCAP in the last 168 hours.  Review of Systems:   Review of Systems  Constitutional: Negative for chills, fever and malaise/fatigue.  HENT: Positive for tinnitus. Negative for hearing loss.   Eyes: Negative for blurred vision, double vision and photophobia.  Respiratory: Positive for shortness of breath. Negative for cough and hemoptysis.   Cardiovascular: Negative for chest pain and palpitations.  Gastrointestinal: Positive for heartburn. Negative for abdominal pain, constipation, diarrhea, nausea and vomiting.  Genitourinary: Negative for dysuria.  Musculoskeletal: Negative for myalgias.  Skin: Negative for rash.  Neurological: Positive for headaches. Negative for dizziness.  Endo/Heme/Allergies: Does not bruise/bleed easily.  Psychiatric/Behavioral: Negative for depression.  HA resolved, tinnitus, pain in legs, doe, heartburn ar all chronic   Past Medical History  She,  has a past medical history of Anxiety, BCC (basal cell carcinoma of skin) (08/19/1991), BCC (basal cell carcinoma of skin) (07/10/1993), BCC (basal cell carcinoma of skin) (04/15/2007), BCC (basal cell carcinoma of skin) (08/23/2008), BCC (basal cell carcinoma of skin) (07/01/2010), BCC (basal cell carcinoma of skin) (07/01/2010), BCC (basal cell carcinoma of skin) (05/12/2011), BCC (basal  cell carcinoma of skin) w/ Sclerosis (04/15/2007), Bowen's disease (04/02/2006), Cancer ([place]), Depression, Diverticulosis, Elevated CK, Glaucoma, HTN (hypertension), Hyperlipidemia, Migraine, Osteoarthritis, SCC (squamous cell carcinoma) Keratoacanthoma (12/11/2014), SCC (squamous cell carcinoma) Keratoacanthoma (08/04/2017), Squamous cell carcinoma in situ (SCCIS) (03/16/2001), Stroke ([place]), Superficial basal cell carcinoma (BCC) w/Adnexal Differentiation (08/29/2009), Superficial nodular basal cell carcinoma (BCC) (08/04/2017), Syncope, and UTI (urinary tract infection).   Surgical History    Past Surgical History:  Procedure Laterality Date  . BASAL CELL CARCINOMA EXCISION    . CATARACT EXTRACTION  2012   bilateral  . SQUAMOUS CELL CARCINOMA EXCISION       Social History   reports that she has never smoked. She has never used smokeless tobacco. She reports current alcohol use. She reports that she does not use drugs.   Family History   Her family history includes Heart disease in her father; Stomach cancer in her mother.   Allergies No Known Allergies   Home Medications  Prior to Admission medications   Medication Sig Start Date End Date Taking? Authorizing Provider  Ascorbic Acid (VITAMIN  C) 1000 MG tablet Take 1,000 mg by mouth daily.   Yes [provider]  aspirin 81 MG tablet Take 81 mg by mouth daily.     Yes [provider]  calcium carbonate (TUMS - DOSED IN MG ELEMENTAL CALCIUM) 500 MG chewable tablet Chew 1 tablet by mouth daily as needed for indigestion or heartburn.   Yes [provider]  Calcium Carbonate-Vitamin D3 (CALCIUM 600-D) 600-400 MG-UNIT TABS Take 1 tablet by mouth every other day.   Yes [provider]  diclofenac sodium (VOLTAREN) 1 % GEL Apply 1 application topically as needed (pain).    Yes [provider]  Naproxen Sodium (ALEVE) 220 MG CAPS Take 1 capsule by mouth in the morning and at bedtime.    Yes [provider]  pantoprazole (PROTONIX) 40 MG tablet Take 40 mg by mouth 2 (two) times daily. 03/30/20  Yes [provider]  polyvinyl alcohol (LIQUIFILM TEARS) 1.4 % ophthalmic solution Place 1 drop into both eyes as needed for dry eyes.    Yes [provider]  triamterene-hydrochlorothiazide (MAXZIDE-25) 37.5-25 MG per tablet Take 0.5 tablets by mouth daily.    Yes [provider]  TURMERIC PO Take 1 tablet by mouth daily.   Yes [provider]  vitamin E (VITAMIN E) 180 MG (400 UNITS) capsule Take 400 Units by mouth daily.   Yes [provider]    Collier Bullock, MD, CCM

## 2020-04-19 NOTE — ED Provider Notes (Signed)
I was informed by the critical care team the patient is not meet admission criteria as she no longer requires IV drip for blood pressure control.  I discussed the MRI and CT findings with Dr. Cheral Marker with neurology.  There is no findings of critical stenosis on CT angio.  If blood pressure is improving, there is no indication for admission. Blood pressure is now improving without any therapy. She is awake alert in no acute distress.  MRI was negative for acute stroke. She is already on antihypertensive. We will continue to monitor and ambulate patient.   Ripley Fraise, MD 04/19/20 [phone]

## 2020-04-19 NOTE — Consult Note (Addendum)
NAME:  Pam Mejia, MRN:  [id number], DOB:  11-Dec-1928, LOS: 0 ADMISSION DATE:  04/18/2020, CONSULTATION DATE:  04/19/20 REFERRING MD:  Maryan Rued, CHIEF COMPLAINT:  aphasia  Brief History    Pam Mejia came to the ER today because she was concerned about intermittent aphasia episodes.  Noted to have severe hypertension on arrival to ED.   History of present illness   Intermittent Aphasia - about three episodes over the past week.  Expressive aphasia episodes, last about 5 min.  Last one occurred this AM, resolved on own, after about 5 min.  She is not the greatest historian but she thinks that these are similar to episodes of TIA she had many years ago.  Visual field abnormalities - she notes squiggly lines and circles that occur with the episodes of aphasia (she thinks they happen at the same time.  She says she also had a headache this morning, which went away " a while ago" but she's not sure exactly when.   Currently is completely asymptomatic.    Arrived at 1pm (?) IN ED Nicardipine started about midnight.  Past Medical History  HTN HLD Syncope TIAs  bcc skin, melanoma,  Migraines  Meds; asa 81, naproxen, triamterene/hctz (37.5/25mg ) Significant Hospital Events     Consults:    Procedures:    Significant Diagnostic Tests:  CT head non contrast: 1. No acute intracranial findings. If persisting clinical concern for acute infarct, MRI is more sensitive and specific for early changes of ischemia. 2. Moderate parenchymal volume loss and chronic microvascular Angiopathy.  CT angio head and neck  IMPRESSION: 1. Negative CTA for emergent large vessel occlusion. 2. Mild for age atheromatous change about the carotid bifurcations and carotid siphons without hemodynamically significant stenosis. 3. Short-segment 40-50% stenosis at the origin of the right external carotid artery. 4. Diffuse tortuosity of the major arterial vasculature of the head and neck, suggesting  chronic underlying hypertension.  MRI 1. No acute intracranial abnormality. 2. Mild chronic small vessel ischemic disease and moderate cerebral atrophy.   Micro Data:    Antimicrobials:    Interim history/subjective:    Objective   Blood pressure (!) 135/53, pulse 64, temperature 97.8 F (36.6 C), resp. rate 16, height 5\' 4"  (1.626 m), weight 79.4 kg, SpO2 97 %.       No intake or output data in the 24 hours ending 04/19/20 0236 Filed Weights   04/18/20 1345  Weight: 79.4 kg    Examination: General: NAD pleasant HENT: NCAT, MMM Lungs: CTAB Cardiovascular: no mgr, slight bradycardia Abdomen: Nt, nd, nbs Extremities: tenderness to shins (Chronic per pt), compressive knee stockings, mild non pitting edema in LE Neuro: a and Ox 3, non focal, 5/5 strength in upper and lower extremities, PERRL, eomi, cn intact   Resolved Hospital Problem list     Assessment & Plan:  Hypertension: asymptomatic.   On my exam BP 140 on low dose nicardipine, which we held.  I do not see that any other po or IV drugs were tried.  No need for infusion of BP meds, could be treated with po or even IV push medications on telemetry floor if preferred.   BP was actually coming down prior to starting any medications, if my review of the records is correct.    Bradycardic - asymptomatic.  Just monitor.  Avoid beta blockers  DOE: chronic.  OP workup would be adequate.   TIA symptoms: Imaging negative for stroke.   Carotid stenosis workup should  be done.   No indication for ICU admission at this time.  Thank you.   Best practice:   Labs   CBC: Recent Labs  Lab 04/18/20 1407  WBC 8.2  NEUTROABS 4.5  HGB 13.6  HCT 40.6  MCV 87.3  PLT 324    Basic Metabolic Panel: Recent Labs  Lab 04/18/20 1407  NA 135  K 4.2  CL 98  CO2 26  GLUCOSE 99  BUN 18  CREATININE 0.93  CALCIUM 9.2   GFR: Estimated Creatinine Clearance: 41.8 mL/min (by C-G formula based on SCr of 0.93 mg/dL). Recent  Labs  Lab 04/18/20 1407  WBC 8.2    Liver Function Tests: Recent Labs  Lab 04/18/20 1407  AST 19  ALT 12  ALKPHOS 64  BILITOT 1.3*  PROT 7.1  ALBUMIN 3.7   No results for input(s): LIPASE, AMYLASE in the last 168 hours. No results for input(s): AMMONIA in the last 168 hours.  ABG No results found for: PHART, PCO2ART, PO2ART, HCO3, TCO2, ACIDBASEDEF, O2SAT   Coagulation Profile: Recent Labs  Lab 04/18/20 1407  INR 1.0    Cardiac Enzymes: No results for input(s): CKTOTAL, CKMB, CKMBINDEX, TROPONINI in the last 168 hours.  HbA1C: No results found for: HGBA1C  CBG: No results for input(s): GLUCAP in the last 168 hours.  Review of Systems:   Review of Systems  Constitutional: Negative for chills, fever and malaise/fatigue.  HENT: Positive for tinnitus. Negative for hearing loss.   Eyes: Negative for blurred vision, double vision and photophobia.  Respiratory: Positive for shortness of breath. Negative for cough and hemoptysis.   Cardiovascular: Negative for chest pain and palpitations.  Gastrointestinal: Positive for heartburn. Negative for abdominal pain, constipation, diarrhea, nausea and vomiting.  Genitourinary: Negative for dysuria.  Musculoskeletal: Negative for myalgias.  Skin: Negative for rash.  Neurological: Positive for headaches. Negative for dizziness.  Endo/Heme/Allergies: Does not bruise/bleed easily.  Psychiatric/Behavioral: Negative for depression.  HA resolved, tinnitus, pain in legs, doe, heartburn ar all chronic   Past Medical History  She,  has a past medical history of Anxiety, BCC (basal cell carcinoma of skin) (08/19/1991), BCC (basal cell carcinoma of skin) (07/10/1993), BCC (basal cell carcinoma of skin) (04/15/2007), BCC (basal cell carcinoma of skin) (08/23/2008), BCC (basal cell carcinoma of skin) (07/01/2010), BCC (basal cell carcinoma of skin) (07/01/2010), BCC (basal cell carcinoma of skin) (05/12/2011), BCC (basal cell carcinoma of  skin) w/ Sclerosis (04/15/2007), Bowen's disease (04/02/2006), Cancer ([place]), Depression, Diverticulosis, Elevated CK, Glaucoma, HTN (hypertension), Hyperlipidemia, Migraine, Osteoarthritis, SCC (squamous cell carcinoma) Keratoacanthoma (12/11/2014), SCC (squamous cell carcinoma) Keratoacanthoma (08/04/2017), Squamous cell carcinoma in situ (SCCIS) (03/16/2001), Stroke ([place]), Superficial basal cell carcinoma (BCC) w/Adnexal Differentiation (08/29/2009), Superficial nodular basal cell carcinoma (BCC) (08/04/2017), Syncope, and UTI (urinary tract infection).   Surgical History    Past Surgical History:  Procedure Laterality Date  . BASAL CELL CARCINOMA EXCISION    . CATARACT EXTRACTION  2012   bilateral  . SQUAMOUS CELL CARCINOMA EXCISION       Social History   reports that she has never smoked. She has never used smokeless tobacco. She reports current alcohol use. She reports that she does not use drugs.   Family History   Her family history includes Heart disease in her father; Stomach cancer in her mother.   Allergies No Known Allergies   Home Medications  Prior to Admission medications   Medication Sig Start Date End Date Taking? Authorizing Provider  Ascorbic Acid (VITAMIN C)  1000 MG tablet Take 1,000 mg by mouth daily.   Yes [provider]  aspirin 81 MG tablet Take 81 mg by mouth daily.     Yes [provider]  calcium carbonate (TUMS - DOSED IN MG ELEMENTAL CALCIUM) 500 MG chewable tablet Chew 1 tablet by mouth daily as needed for indigestion or heartburn.   Yes [provider]  Calcium Carbonate-Vitamin D3 (CALCIUM 600-D) 600-400 MG-UNIT TABS Take 1 tablet by mouth every other day.   Yes [provider]  diclofenac sodium (VOLTAREN) 1 % GEL Apply 1 application topically as needed (pain).    Yes [provider]  Naproxen Sodium (ALEVE) 220 MG CAPS Take 1 capsule by mouth in the morning and at bedtime.    Yes [provider]    pantoprazole (PROTONIX) 40 MG tablet Take 40 mg by mouth 2 (two) times daily. 03/30/20  Yes [provider]  polyvinyl alcohol (LIQUIFILM TEARS) 1.4 % ophthalmic solution Place 1 drop into both eyes as needed for dry eyes.    Yes [provider]  triamterene-hydrochlorothiazide (MAXZIDE-25) 37.5-25 MG per tablet Take 0.5 tablets by mouth daily.    Yes [provider]  TURMERIC PO Take 1 tablet by mouth daily.   Yes [provider]  vitamin E (VITAMIN E) 180 MG (400 UNITS) capsule Take 400 Units by mouth daily.   Yes [provider]   critical care time 35 minutes Collier Bullock, MD, CCM

## 2020-04-19 NOTE — ED Provider Notes (Signed)
Patient improved. She is ambulatory. Blood pressure 122/60 No chest pain is reported. Troponins were sent by critical care team, mildly elevated but no change on repeat. Likely due to recent high blood pressure. Low suspicion for ACS.  EKG Interpretation  Date/Time:  Wednesday April 18 2020 13:42:23 EDT Ventricular Rate:  57 PR Interval:    QRS Duration: 97 QT Interval:  444 QTC Calculation: 433 R Axis:     Text Interpretation: Sinus rhythm Borderline prolonged PR interval Low voltage, precordial leads No previous tracing Confirmed by Blanchie Dessert [phone]) on 04/18/2020 1:57:24 PM       Patient feels comfortable for discharge. I discussed the case with her daughter via phone and she is agreeable with plan   Ripley Fraise, MD 04/19/20 [phone]

## 2020-04-19 NOTE — ED Notes (Signed)
Patient ambulates to the restroom independently

## 2020-04-23 DIAGNOSIS — Z1231 Encounter for screening mammogram for malignant neoplasm of breast: Secondary | ICD-10-CM | POA: Diagnosis not present

## 2020-04-27 DIAGNOSIS — I160 Hypertensive urgency: Secondary | ICD-10-CM | POA: Diagnosis not present

## 2020-04-27 DIAGNOSIS — I6521 Occlusion and stenosis of right carotid artery: Secondary | ICD-10-CM | POA: Diagnosis not present

## 2020-04-27 DIAGNOSIS — E785 Hyperlipidemia, unspecified: Secondary | ICD-10-CM | POA: Diagnosis not present

## 2020-04-27 DIAGNOSIS — I10 Essential (primary) hypertension: Secondary | ICD-10-CM | POA: Diagnosis not present

## 2020-05-02 NOTE — Progress Notes (Signed)
Cardiology Office Note:   Date:  05/03/2020  NAME:  Pam Mejia    MRN: [id number] DOB:  Jan 02, 1929   PCP:  Burnard Bunting, MD  Cardiologist:  No primary care provider on file.   Referring MD: Burnard Bunting, MD   Chief Complaint  Patient presents with   Shortness of Breath   History of Present Illness:   Pam Mejia is a 86 y.o. female with a hx of HTN who is being seen today for the evaluation of dyspnea on exertion at the request of Burnard Bunting, MD.  Recently evaluated emergency room on 04/19/2020 with concerns for TIA.  CT and MRI were negative for stroke.  She reports she possibly had a TIA last week.  She evaluated emergency room.  Apparently this coincided with elevated blood pressures.  In the emergency room she ruled out for stroke.  Her troponins were mildly elevated.  She reports that for the past 1 year she is had intermittent episodes shortness of breath.  Apparently she tries to do any strenuous activity she gets profoundly out of breath.  Things are getting worse.  Symptoms are occurring daily.  She denies any chest pain or chest pressure.  Her EKG today demonstrates normal sinus rhythm with no acute ST-T changes.  She describes no palpitations or concerns for atrial fibrillation.  Apparently her symptoms of TIA were aphasia.  She could not get the words out.  She reports she saw her cardiologist in the early 2000's and had a stress test.  She reports she just has not followed with cardiology since that time.  She is on aspirin Crestor for TIA.  Her medical history is really only significant for TIAs and hypertension.  She only smoked for about 2 years in her 43s.  There is no strong family history heart disease.  Regarding her shortness of breath she apparently used to go to water aerobics.  She reports that due to Covid she is not able to do this.  She has had some deconditioning it appears but she is concerned about her shortness of breath.  Labs from care  physician demonstrate total cholesterol 171, HDL 58, LDL 104, triglycerides 44, creatinine 0.90, TSH 2.44  Problem List 1. HTN 2. TIA 3. Mild carotid artery disease -50% right external carotid disease 4.COPD  Past Medical History: 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   Left Eyebrow - SCC in Situ   Cancer ([place])    melanoma   Depression    Diverticulosis    Elevated CK    likely secondary to trauma   Glaucoma    HTN (hypertension)    Hyperlipidemia    Migraine    Osteoarthritis    SCC (squamous cell carcinoma) Keratoacanthoma 12/11/2014   Left Jawline   SCC (squamous  cell carcinoma) Keratoacanthoma 08/04/2017   Right Hand   Squamous cell carcinoma in situ (SCCIS) 03/16/2001   Left Collarbone   Stroke [facility])    pt states she has had TIA's   Superficial basal cell carcinoma (BCC) w/Adnexal Differentiation 08/29/2009   Right Frontal Hairline   Superficial nodular basal cell carcinoma (BCC) 08/04/2017   Right Nose   Syncope    UTI (urinary tract infection)     Past Surgical History: Past Surgical History:  Procedure Laterality Date   BASAL CELL CARCINOMA EXCISION     CATARACT EXTRACTION  2012   bilateral   SQUAMOUS CELL CARCINOMA EXCISION      Current Medications: Current Meds  Medication Sig   Ascorbic Acid (VITAMIN C) 1000 MG tablet Take 1,000 mg by mouth  daily.   aspirin 81 MG tablet Take 81 mg by mouth daily.     calcium carbonate (TUMS - DOSED IN MG ELEMENTAL CALCIUM) 500 MG chewable tablet Chew 1 tablet by mouth daily as needed for indigestion or heartburn.   Calcium Carbonate-Vitamin D3 (CALCIUM 600-D) 600-400 MG-UNIT TABS Take 1 tablet by mouth every other day.   diclofenac sodium (VOLTAREN) 1 % GEL Apply 1 application topically as needed (pain).    Naproxen Sodium (ALEVE) 220 MG CAPS Take 1 capsule by mouth in the morning and at bedtime.    pantoprazole (PROTONIX) 40 MG tablet Take 40 mg by mouth 2 (two) times daily.   polyvinyl alcohol (LIQUIFILM TEARS) 1.4 % ophthalmic solution Place 1 drop into both eyes as needed for dry eyes.    triamterene-hydrochlorothiazide (MAXZIDE-25) 37.5-25 MG per tablet Take 0.5 tablets by mouth daily.    TURMERIC PO Take 1 tablet by mouth daily.   vitamin E (VITAMIN E) 180 MG (400 UNITS) capsule Take 400 Units by mouth daily.     Allergies:    Patient has no known allergies.   Social History: Social History   Socioeconomic History   Marital status: Widowed    Spouse name: Not on file   Number of children: 2   Years of education: Not on file   Highest education level: Not on file  Occupational History   Occupation: Retired  Tobacco Use   Smoking status: Former Smoker    Years: 3.00   Smokeless tobacco: Never Used  Substance and Sexual Activity   Alcohol use: Yes    Comment: 2 mixed drinks/ day   Drug use: No   Sexual activity: Not on file  Other Topics Concern   Not on file  Social History Narrative   Not on file   Social Determinants of Health   Financial Resource Strain:    Difficulty of Paying Living Expenses:   Food Insecurity:    Worried About Charity fundraiser in the Last Year:    Arboriculturist in the Last Year:   Transportation Needs:    Film/video editor (Medical):    Lack of Transportation (Non-Medical):   Physical Activity:    Days  of Exercise per Week:    Minutes of Exercise per Session:   Stress:    Feeling of Stress :   Social Connections:    Frequency of Communication with Friends and Family:    Frequency of Social Gatherings with Friends and Family:    Attends Religious Services:    Active Member of Clubs or Organizations:    Attends Archivist Meetings:    Marital Status:      Family  History: The patient's family history includes Heart disease in her father; Stomach cancer in her mother.  ROS:   All other ROS reviewed and negative. Pertinent positives noted in the HPI.     EKGs/Labs/Other Studies Reviewed:   The following studies were personally reviewed by me today:  EKG:  EKG is ordered today.  The ekg ordered today demonstrates normal sinus rhythm, heart rate 66, nonspecific ST-T changes, no evidence of prior infarct, and was personally reviewed by me.   TTE 2014  - Left ventricle: The cavity size was normal. Wall thickness  was increased in a pattern of mild LVH. Systolic function  was normal. The estimated ejection fraction was in the  range of 60% to 65%. Wall motion was normal; there were no  regional wall motion abnormalities. Doppler parameters are  consistent with abnormal left ventricular relaxation  (grade 1 diastolic dysfunction).  - Atrial septum: No defect or patent foramen ovale was  identified.    Recent Labs: 04/18/2020: ALT 12; BUN 18; Creatinine, Ser 0.93; Hemoglobin 13.6; Platelets 195; Potassium 4.2; Sodium 135   Recent Lipid Panel No results found for: CHOL, TRIG, HDL, CHOLHDL, VLDL, LDLCALC, LDLDIRECT  Physical Exam:   VS:  BP 128/72    Pulse (!) 141    Ht 5\' 4"  (1.626 m)    Wt 184 lb 6.4 oz (83.6 kg)    SpO2 98%    BMI 31.65 kg/m    Wt Readings from Last 3 Encounters:  05/03/20 184 lb 6.4 oz (83.6 kg)  04/18/20 175 lb (79.4 kg)  07/14/15 159 lb 3.2 oz (72.2 kg)    General: Well nourished, well developed, in no acute distress Heart:  Atraumatic, normal size  Eyes: PEERLA, EOMI  Neck: Supple, no JVD Endocrine: No thryomegaly Cardiac: Normal S1, S2; RRR; no murmurs, rubs, or gallops Lungs: Clear to auscultation bilaterally, no wheezing, rhonchi or rales  Abd: Soft, nontender, no hepatomegaly  Ext: No edema, pulses 2+ Musculoskeletal: No deformities, BUE and BLE strength normal and equal Skin: Warm and dry, no rashes   Neuro: Alert and oriented to person, place, time, and situation, CNII-XII grossly intact, no focal deficits  Psych: Normal mood and affect   ASSESSMENT:   Pam Mejia is a 86 y.o. female who presents for the following: 1. DOE (dyspnea on exertion)   2. Essential hypertension     PLAN:   1. DOE (dyspnea on exertion) -No evidence of heart failure on exam.  Was in the emergency room recently with concerns for TIA mildly elevated troponin.  She will need a Lexiscan nuclear medicine stress test to exclude effective CAD.  She has no murmurs on exam.  Her EKG is normal.  I would like to check a BNP and echocardiogram as well.  We will also check thyroid studies.  She will also follow-up with neurology to see if she actually did have a TIA.  We may consider wearing a monitor to rule out atrial fibrillation if this is the case.  She is on aspirin and statin.  2. Essential hypertension -Blood pressure well controlled.  She will continue her Maxide.  Disposition: Return in about 3 months (around 08/03/2020).  Medication Adjustments/Labs and Tests Ordered: Current medicines are reviewed at length with the patient today.  Concerns regarding medicines are outlined above.  Orders Placed This Encounter  Procedures   Brain natriuretic peptide   TSH   MYOCARDIAL PERFUSION IMAGING   EKG 12-Lead   ECHOCARDIOGRAM COMPLETE  No orders of the defined types were placed in this encounter.   Patient Instructions  Medication Instructions:  The current medical regimen is effective;  continue present plan and  medications.  *If you need a refill on your cardiac medications before your next appointment, please call your pharmacy*   Lab Work: BNP, TSH today   If you have labs (blood work) drawn today and your tests are completely normal, you will receive your results only by:  [place] [facility] (if you have MyChart) OR  A paper copy in the mail If you have any lab test that is abnormal or we need to change your treatment, we will call you to review the results.   Testing/Procedures: Echocardiogram - Your physician has requested that you have an echocardiogram. Echocardiography is a painless test that uses sound waves to create images of your heart. It provides your doctor with information about the size and shape of your heart and how well your hearts chambers and valves are working. This procedure takes approximately one hour. There are no restrictions for this procedure. This will be performed at our [facility] location - [address].  Your physician has requested that you have a lexiscan myoview. A cardiac stress test is a cardiological test that measures the heart's ability to respond to external stress in a controlled clinical environment. The stress response is induced by intravenous pharmacological stimulation.    Follow-Up: At [facility], you and your health needs are our priority.  As part of our continuing mission to provide you with exceptional heart care, we have created designated Provider Care Teams.  These Care Teams include your primary Cardiologist (physician) and Advanced Practice Providers (APPs -  Physician Assistants and Nurse Practitioners) who all work together to provide you with the care you need, when you need it.  We recommend signing up for the patient portal called "MyChart".  Sign up information is provided on this After Visit Summary.  MyChart is used to connect with patients for Virtual Visits (Telemedicine).  Patients are able to view lab/test  results, encounter notes, upcoming appointments, etc.  Non-urgent messages can be sent to your provider as well.   To learn more about what you can do with MyChart, go to [email or web address].    Your next appointment:   3 month(s)  The format for your next appointment:   In Person  Provider:   Eleonore Chiquito, MD          Signed, Addison Naegeli. Audie Box, [place]  [address] [place], [place] [address] [phone]  05/03/2020 10:57 AM

## 2020-05-03 ENCOUNTER — Ambulatory Visit: Payer: Medicare Other | Admitting: Cardiovascular Disease

## 2020-05-03 ENCOUNTER — Encounter: Payer: Self-pay | Admitting: Cardiovascular Disease

## 2020-05-03 ENCOUNTER — Other Ambulatory Visit: Payer: Self-pay

## 2020-05-03 VITALS — BP 128/72 | HR 141 | Ht 64.0 in | Wt 184.4 lb

## 2020-05-03 DIAGNOSIS — I10 Essential (primary) hypertension: Secondary | ICD-10-CM

## 2020-05-03 DIAGNOSIS — R0609 Other forms of dyspnea: Secondary | ICD-10-CM

## 2020-05-03 DIAGNOSIS — R0600 Dyspnea, unspecified: Secondary | ICD-10-CM | POA: Diagnosis not present

## 2020-05-03 NOTE — Patient Instructions (Signed)
Medication Instructions:  The current medical regimen is effective;  continue present plan and medications.  *If you need a refill on your cardiac medications before your next appointment, please call your pharmacy*   Lab Work: BNP, TSH today   If you have labs (blood work) drawn today and your tests are completely normal, you will receive your results only by: Marland Kitchen MyChart Message (if you have MyChart) OR . A paper copy in the mail If you have any lab test that is abnormal or we need to change your treatment, we will call you to review the results.   Testing/Procedures: Echocardiogram - Your physician has requested that you have an echocardiogram. Echocardiography is a painless test that uses sound waves to create images of your heart. It provides your doctor with information about the size and shape of your heart and how well your heart's chambers and valves are working. This procedure takes approximately one hour. There are no restrictions for this procedure. This will be performed at our [facility] location - [address], [address].  Your physician has requested that you have a lexiscan myoview. A cardiac stress test is a cardiological test that measures the heart's ability to respond to external stress in a controlled clinical environment. The stress response is induced by intravenous pharmacological stimulation.    Follow-Up: At [facility], you and your health needs are our priority.  As part of our continuing mission to provide you with exceptional heart care, we have created designated Provider Care Teams.  These Care Teams include your primary Cardiologist (physician) and Advanced Practice Providers (APPs -  Physician Assistants and Nurse Practitioners) who all work together to provide you with the care you need, when you need it.  We recommend signing up for the patient portal called "MyChart".  Sign up information is provided on this After Visit Summary.  MyChart is used to  connect with patients for Virtual Visits (Telemedicine).  Patients are able to view lab/test results, encounter notes, upcoming appointments, etc.  Non-urgent messages can be sent to your provider as well.   To learn more about what you can do with MyChart, go to [email or web address].    Your next appointment:   3 month(s)  The format for your next appointment:   In Person  Provider:   Eleonore Chiquito, MD

## 2020-05-04 LAB — BRAIN NATRIURETIC PEPTIDE: BNP: 119.8 pg/mL — ABNORMAL HIGH (ref 0.0–100.0)

## 2020-05-04 LAB — TSH: TSH: 5.81 u[IU]/mL — ABNORMAL HIGH (ref 0.450–4.500)

## 2020-05-08 ENCOUNTER — Telehealth (HOSPITAL_COMMUNITY): Payer: Self-pay | Admitting: *Deleted

## 2020-05-08 NOTE — Telephone Encounter (Signed)
Left message on voicemail in reference to upcoming appointment scheduled for 05/10/20. Phone number given for a call back so details instructions can be given.  Pam Mejia

## 2020-05-10 ENCOUNTER — Encounter (HOSPITAL_COMMUNITY): Payer: Medicare Other

## 2020-05-10 DIAGNOSIS — J449 Chronic obstructive pulmonary disease, unspecified: Secondary | ICD-10-CM | POA: Diagnosis not present

## 2020-05-10 DIAGNOSIS — I6521 Occlusion and stenosis of right carotid artery: Secondary | ICD-10-CM | POA: Diagnosis not present

## 2020-05-10 DIAGNOSIS — I10 Essential (primary) hypertension: Secondary | ICD-10-CM | POA: Diagnosis not present

## 2020-05-10 DIAGNOSIS — G459 Transient cerebral ischemic attack, unspecified: Secondary | ICD-10-CM | POA: Diagnosis not present

## 2020-05-15 ENCOUNTER — Other Ambulatory Visit: Payer: Self-pay

## 2020-05-15 ENCOUNTER — Encounter: Payer: Self-pay | Admitting: Vascular Surgery

## 2020-05-15 ENCOUNTER — Ambulatory Visit (INDEPENDENT_AMBULATORY_CARE_PROVIDER_SITE_OTHER): Payer: Medicare Other | Admitting: Vascular Surgery

## 2020-05-15 DIAGNOSIS — I6523 Occlusion and stenosis of bilateral carotid arteries: Secondary | ICD-10-CM

## 2020-05-15 DIAGNOSIS — I779 Disorder of arteries and arterioles, unspecified: Secondary | ICD-10-CM | POA: Insufficient documentation

## 2020-05-15 NOTE — Progress Notes (Signed)
Patient name: Pam Mejia MRN: [id number] DOB: Feb 28, 1929 Sex: female  REASON FOR CONSULT: Evaluate right carotid stenosis  HPI: Pam Mejia is a 87 y.o. female, with history of hypertension, hyperlipidemia, melanoma, TIA, migraine that presents for evaluation of right carotid stenosis.  Patient was in her normal state of health until middle of June when she had an event at [facility] where she lives.  She states she woke up with a headache and also noted blurry wavy vision in both eyes while working a puzzle.  At the same time she had some trouble expressing some words that she describes as garbled.  She states ultimately she went to her PCP Dr. Reynaldo Minium with [facility] and was referred to the ED.  She had a stroke work-up there that did not show any acute events on her MRI.  CTA neck was negative for large vessel occlusion and only had some mild changes at the carotid bifurcation and then a short segment 40 to 50% stenosis at the origin of the right external carotid artery.  She states at the same time her blood pressure was noted to be around 190-210 and no subsequent events since discharge from the ED.  Remains on aspirin and crestor.  Past Medical History:  Diagnosis Date  . Anxiety   . BCC (basal cell carcinoma of skin) 08/19/1991   Left Jawline  . BCC (basal cell carcinoma of skin) 07/10/1993   Left Forehead  . BCC (basal cell carcinoma of skin) 04/15/2007   Right Chin  . BCC (basal cell carcinoma of skin) 08/23/2008   Left Cheek  . BCC (basal cell carcinoma of skin) 07/01/2010   Bridge Of Nose  . BCC (basal cell carcinoma of skin) 07/01/2010   Bulb Of Nose  . BCC (basal cell carcinoma of skin) 05/12/2011   Right Lower Cheek/Chin  . BCC (basal cell carcinoma of skin) w/ Sclerosis 04/15/2007   Crease Of Left Nare  . 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 Cancer ([place])    melanoma  . Depression   . Diverticulosis   . Elevated CK     likely secondary to trauma  . Glaucoma   . HTN (hypertension)   . Hyperlipidemia   . Migraine   . Osteoarthritis   . SCC (squamous cell carcinoma) Keratoacanthoma 12/11/2014   Left Jawline  . SCC (squamous cell carcinoma) Keratoacanthoma 08/04/2017   Right Hand  . Squamous cell carcinoma in situ (SCCIS) 03/16/2001   Left Collarbone  . Stroke [facility])    pt states she has had TIA's  . Superficial basal cell carcinoma (BCC) w/Adnexal Differentiation 08/29/2009   Right Frontal Hairline  . Superficial nodular basal cell carcinoma (BCC) 08/04/2017   Right Nose  . Syncope   . UTI (urinary tract infection)     Past Surgical History:  Procedure Laterality Date  . BASAL CELL CARCINOMA EXCISION    . CATARACT EXTRACTION  2012   bilateral  . SQUAMOUS CELL CARCINOMA EXCISION      Family History  Problem Relation Age of Onset  . Stomach cancer Mother   . Heart disease Father     SOCIAL HISTORY: Social History   Socioeconomic History  . Marital status: Widowed    Spouse name: Not on file  . Number of children: 2  . Years of education: Not on file  . Highest education level: Not on file  Occupational History  . Occupation: Retired  Tobacco Use  . Smoking status: Former Smoker    Years: 3.00  . Smokeless tobacco: Never Used  Substance and Sexual Activity  . Alcohol use: Yes    Comment: 2 mixed drinks/ day  . Drug use: No  . Sexual activity: Not on file  Other Topics Concern  . Not on file  Social History Narrative  . Not on file   Social Determinants of Health   Financial Resource Strain:   . Difficulty of Paying Living Expenses:   Food Insecurity:   . Worried About Charity fundraiser in the Last Year:   . Arboriculturist in the Last Year:   Transportation Needs:   . Film/video editor (Medical):   Marland Kitchen Lack of Transportation (Non-Medical):   Physical Activity:   . Days of Exercise per Week:   . Minutes of Exercise per Session:   Stress:   . Feeling of  Stress :   Social Connections:   . Frequency of Communication with Friends and Family:   . Frequency of Social Gatherings with Friends and Family:   . Attends Religious Services:   . Active Member of Clubs or Organizations:   . Attends Archivist Meetings:   Marland Kitchen Marital Status:   Intimate Partner Violence:   . Fear of Current or Ex-Partner:   . Emotionally Abused:   Marland Kitchen Physically Abused:   . Sexually Abused:     No Known Allergies  Current Outpatient Medications  Medication Sig Dispense Refill  . Ascorbic Acid (VITAMIN C) 1000 MG tablet Take 1,000 mg by mouth daily.    Marland Kitchen aspirin 81 MG tablet Take 81 mg by mouth daily.      . calcium carbonate (TUMS - DOSED IN MG ELEMENTAL CALCIUM) 500 MG chewable tablet Chew 1 tablet by mouth daily as needed for indigestion or heartburn.    . Calcium Carbonate-Vitamin D3 (CALCIUM 600-D) 600-400 MG-UNIT TABS Take 1 tablet by mouth every other day.    . diclofenac sodium (VOLTAREN) 1 % GEL Apply 1 application topically as needed (pain).     . Naproxen Sodium (ALEVE) 220 MG CAPS Take 1 capsule by mouth in the morning and at bedtime.     Marland Kitchen olmesartan (BENICAR) 20 MG tablet Take 20 mg by mouth daily.    . pantoprazole (PROTONIX) 40 MG tablet Take 40 mg by mouth 2 (two) times daily.    . polyvinyl alcohol (LIQUIFILM TEARS) 1.4 % ophthalmic solution Place 1 drop into both eyes as needed for dry eyes.     . rosuvastatin (CRESTOR) 20 MG tablet Take 20 mg by mouth at bedtime.    . triamterene-hydrochlorothiazide (MAXZIDE-25) 37.5-25 MG per tablet Take 0.5 tablets by mouth daily.     . TURMERIC PO Take 1 tablet by mouth daily.    . vitamin E (VITAMIN E) 180 MG (400 UNITS) capsule Take 400 Units by mouth daily.     No current facility-administered medications for this visit.    REVIEW OF SYSTEMS:  [X]  denotes positive finding, [ ]  denotes negative finding Cardiac  Comments:  Chest pain or chest pressure:    Shortness of breath upon exertion:      Short of breath when lying flat:    Irregular heart rhythm:        Vascular    Pain in calf, thigh, or hip brought on by ambulation:    Pain in feet at night that wakes you up from your sleep:  Blood clot in your veins:    Leg swelling:         Pulmonary    Oxygen at home:    Productive cough:     Wheezing:         Neurologic    Sudden weakness in arms or legs:     Sudden numbness in arms or legs:     Sudden onset of difficulty speaking or slurred speech:    Temporary loss of vision in one eye:     Problems with dizziness:         Gastrointestinal    Blood in stool:     Vomited blood:         Genitourinary    Burning when urinating:     Blood in urine:        Psychiatric    Major depression:         Hematologic    Bleeding problems:    Problems with blood clotting too easily:        Skin    Rashes or ulcers:        Constitutional    Fever or chills:      PHYSICAL EXAM: Vitals:   05/15/20 1155 05/15/20 1201  BP: 128/76 133/78  Pulse: 63 64  Resp: 14   Temp: 98.1 F (36.7 C)   TempSrc: Temporal   SpO2: 98%   Weight: 185 lb (83.9 kg)   Height: 5\' 4"  (1.626 m)     GENERAL: The patient is a well-nourished female, in no acute distress. The vital signs are documented above. CARDIAC: There is a regular rate and rhythm.  VASCULAR:  Palpable femoral pulses bilaterally Palpable posterior tibial pulses bilaterally PULMONARY: There is good air exchange bilaterally without wheezing or rales. ABDOMEN: Soft and non-tender with normal pitched bowel sounds.  MUSCULOSKELETAL: There are no major deformities or cyanosis. NEUROLOGIC: No focal weakness or paresthesias are detected.  Cranial nerves II through XII grossly intact.   SKIN: There are no ulcers or rashes noted. PSYCHIATRIC: The patient has a normal affect.  DATA:   I independently reviewed her CTA neck from 04/18/2020 and I do not see any evidence of significant carotid bifurcation disease or ICA  disease.  Assessment/Plan:  86 year old female presents for evaluation of carotid disease after recent work-up in the ED when she had headache, blurry vision and expressive aphasia.  I agree with Dr. Jacquiline Doe notes its unclear if this was a small TIA versus hypertensive urgency.  Her MRI was negative for any acute event.  Her CTA neck on my review does not show any significant bifurcation or ICA disease that would warrant surgery from my standpoint.  Discussed current guidelines are for surgical intervention for greater than 50% carotid stenosis.  I did discuss the 40 to 50% stenosis noted in the right external carotid artery but would not need any intervention for this from my standpoint given the ICA/bifucation is the vessel of interest.  I think it is reasonable that she stays on aspirin and Crestor for overall risk reduction.  I will have her follow-up in 1 year for carotid duplex and further surveillance.   Marty Heck, MD [facility] Office: [phone]

## 2020-05-22 ENCOUNTER — Telehealth (HOSPITAL_COMMUNITY): Payer: Self-pay | Admitting: *Deleted

## 2020-05-22 NOTE — Telephone Encounter (Signed)
Patient given detailed instructions per Myocardial Perfusion Study Information Sheet for the test on 05/25/20 Patient notified to arrive 15 minutes early and that it is imperative to arrive on time for appointment to keep from having the test rescheduled.  If you need to cancel or reschedule your appointment, please call the office within 24 hours of your appointment. . Patient verbalized understanding. Pam Mejia

## 2020-05-25 ENCOUNTER — Ambulatory Visit (HOSPITAL_COMMUNITY): Payer: Medicare Other | Attending: Cardiology

## 2020-05-25 ENCOUNTER — Other Ambulatory Visit: Payer: Self-pay

## 2020-05-25 ENCOUNTER — Ambulatory Visit (HOSPITAL_BASED_OUTPATIENT_CLINIC_OR_DEPARTMENT_OTHER): Payer: Medicare Other

## 2020-05-25 DIAGNOSIS — R0609 Other forms of dyspnea: Secondary | ICD-10-CM

## 2020-05-25 DIAGNOSIS — R0600 Dyspnea, unspecified: Secondary | ICD-10-CM

## 2020-05-25 LAB — MYOCARDIAL PERFUSION IMAGING
LV dias vol: 68 mL (ref 46–106)
LV sys vol: 19 mL
Peak HR: 83 {beats}/min
Rest HR: 57 {beats}/min
SDS: 6
SRS: 0
SSS: 6
TID: 0.92

## 2020-05-25 LAB — ECHOCARDIOGRAM COMPLETE
Area-P 1/2: 4.06 cm2
Height: 64 in
S' Lateral: 2.6 cm
Weight: 2944 oz

## 2020-05-25 MED ORDER — TECHNETIUM TC 99M TETROFOSMIN IV KIT
10.3000 | PACK | Freq: Once | INTRAVENOUS | Status: AC | PRN
  Administered 2020-05-25: 10.3 via INTRAVENOUS
  Filled 2020-05-25: qty 11

## 2020-05-25 MED ORDER — TECHNETIUM TC 99M TETROFOSMIN IV KIT
30.8000 | PACK | Freq: Once | INTRAVENOUS | Status: AC | PRN
  Administered 2020-05-25: 30.8 via INTRAVENOUS
  Filled 2020-05-25: qty 31

## 2020-05-25 MED ORDER — REGADENOSON 0.4 MG/5ML IV SOLN
0.4000 mg | Freq: Once | INTRAVENOUS | Status: AC
  Administered 2020-05-25: 0.4 mg via INTRAVENOUS

## 2020-06-26 DIAGNOSIS — Z85828 Personal history of other malignant neoplasm of skin: Secondary | ICD-10-CM | POA: Diagnosis not present

## 2020-06-26 DIAGNOSIS — L218 Other seborrheic dermatitis: Secondary | ICD-10-CM | POA: Diagnosis not present

## 2020-06-26 DIAGNOSIS — L905 Scar conditions and fibrosis of skin: Secondary | ICD-10-CM | POA: Diagnosis not present

## 2020-06-27 DIAGNOSIS — E039 Hypothyroidism, unspecified: Secondary | ICD-10-CM | POA: Diagnosis not present

## 2020-06-27 DIAGNOSIS — E785 Hyperlipidemia, unspecified: Secondary | ICD-10-CM | POA: Diagnosis not present

## 2020-06-27 DIAGNOSIS — M859 Disorder of bone density and structure, unspecified: Secondary | ICD-10-CM | POA: Diagnosis not present

## 2020-07-04 DIAGNOSIS — M1990 Unspecified osteoarthritis, unspecified site: Secondary | ICD-10-CM | POA: Diagnosis not present

## 2020-07-04 DIAGNOSIS — Z0000 Encounter for general adult medical examination without abnormal findings: Secondary | ICD-10-CM | POA: Diagnosis not present

## 2020-07-04 DIAGNOSIS — J449 Chronic obstructive pulmonary disease, unspecified: Secondary | ICD-10-CM | POA: Diagnosis not present

## 2020-07-04 DIAGNOSIS — I10 Essential (primary) hypertension: Secondary | ICD-10-CM | POA: Diagnosis not present

## 2020-07-04 DIAGNOSIS — R82998 Other abnormal findings in urine: Secondary | ICD-10-CM | POA: Diagnosis not present

## 2020-07-05 DIAGNOSIS — H43811 Vitreous degeneration, right eye: Secondary | ICD-10-CM | POA: Diagnosis not present

## 2020-07-05 DIAGNOSIS — H353132 Nonexudative age-related macular degeneration, bilateral, intermediate dry stage: Secondary | ICD-10-CM | POA: Diagnosis not present

## 2020-07-05 DIAGNOSIS — Z1212 Encounter for screening for malignant neoplasm of rectum: Secondary | ICD-10-CM | POA: Diagnosis not present

## 2020-07-05 DIAGNOSIS — Z961 Presence of intraocular lens: Secondary | ICD-10-CM | POA: Diagnosis not present

## 2020-07-05 DIAGNOSIS — H26491 Other secondary cataract, right eye: Secondary | ICD-10-CM | POA: Diagnosis not present

## 2020-08-05 NOTE — Progress Notes (Signed)
Cardiology Office Note:   Date:  08/07/2020  NAME:  Pam Mejia    MRN: [id number] DOB:  01-Jul-1929   PCP:  Burnard Bunting, MD  Cardiologist:  No primary care provider on file.   Referring MD: Burnard Bunting, MD   Chief Complaint  Patient presents with  . Shortness of Breath   History of Present Illness:   Pam Mejia is a 86 y.o. female with a hx of HTN, TIA, external carotid artery disease who presents for follow-up of SOB.  Echo with normal left ventricular function and questionable diastolic dysfunction. Normal stress test.  She reports she still getting short of breath.  When she does any heavy exertion such as climbing stairs she gets quite winded.  [facility]  She does water aerobics for up to 1 hour.  Gets quite short of breath.  Her BNP was minimally elevated at her last visit.  Weights continue to go up.  I did inform her that we could try switching her to Lasix from her HCTZ to see if this helps.  I would also like to recheck a BNP today.  Her blood pressure is well controlled.  She did follow-up her abnormal TSH of 5.8 and was informed she did not need to be treated for this.  She does report fatigue and snoring.  She also reports that she naps easily.  Sleep apnea is likely a concern but she declines any further testing.  She was switched over to Lipitor from Crestor due to muscle aches.  Overall appears to be the same.  Not improving.  Weight is increasing.  Does not appear grossly volume overloaded on exam today.  Problem List 1. HTN 2. TIA 3. Mild carotid artery disease -50% right external carotid disease 4. HFpEF -BNP 119 -Indeterminate diastolic function  Past Medical History: Past Medical History:  Diagnosis Date  . Anxiety   . BCC (basal cell carcinoma of skin) 08/19/1991   Left Jawline  . BCC (basal cell carcinoma of skin) 07/10/1993   Left Forehead  . BCC (basal cell carcinoma of skin) 04/15/2007   Right Chin  .  BCC (basal cell carcinoma of skin) 08/23/2008   Left Cheek  . BCC (basal cell carcinoma of skin) 07/01/2010   Bridge Of Nose  . BCC (basal cell carcinoma of skin) 07/01/2010   Bulb Of Nose  . BCC (basal cell carcinoma of skin) 05/12/2011   Right Lower Cheek/Chin  . BCC (basal cell carcinoma of skin) w/ Sclerosis 04/15/2007   Crease Of Left Nare  . 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 Cancer ([place])    melanoma  . Depression   . Diverticulosis   . Elevated CK    likely secondary to trauma  . Glaucoma   . HTN (hypertension)   . Hyperlipidemia   . Migraine   . Osteoarthritis   . SCC (squamous cell carcinoma) Keratoacanthoma 12/11/2014   Left Jawline  . SCC (squamous cell carcinoma) Keratoacanthoma 08/04/2017   Right Hand  . Squamous cell carcinoma in situ (SCCIS) 03/16/2001   Left Collarbone  . Stroke [facility])    pt states she has had TIA's  . Superficial basal cell carcinoma (BCC) w/Adnexal Differentiation 08/29/2009   Right Frontal Hairline  . Superficial nodular basal cell carcinoma (BCC) 08/04/2017   Right Nose  . Syncope   . UTI (urinary tract infection)     Past Surgical History:  Past Surgical History:  Procedure Laterality Date  . BASAL CELL CARCINOMA EXCISION    . CATARACT EXTRACTION  2012   bilateral  . SQUAMOUS CELL CARCINOMA EXCISION      Current Medications: Current Meds  Medication Sig  . Ascorbic Acid (VITAMIN C) 1000 MG tablet Take 1,000 mg by mouth daily.  Marland Kitchen aspirin 81 MG tablet Take 81 mg by mouth daily.    . calcium carbonate (TUMS - DOSED IN MG ELEMENTAL CALCIUM) 500 MG chewable tablet Chew 1 tablet by mouth daily as needed for indigestion or heartburn.  . Calcium Carbonate-Vitamin D3 (CALCIUM 600-D) 600-400 MG-UNIT TABS Take 1 tablet by mouth every other day.  . diclofenac sodium (VOLTAREN) 1 % GEL Apply 1 application topically as needed (pain).   . Multiple Vitamins-Minerals (PRESERVISION AREDS) CAPS Take by mouth daily.  .  Naproxen Sodium (ALEVE) 220 MG CAPS Take 1 capsule by mouth in the morning and at bedtime.   . pantoprazole (PROTONIX) 40 MG tablet Take 40 mg by mouth 2 (two) times daily.  . polyvinyl alcohol (LIQUIFILM TEARS) 1.4 % ophthalmic solution Place 1 drop into both eyes as needed for dry eyes.   . TURMERIC PO Take 1 tablet by mouth daily.  . vitamin E (VITAMIN E) 180 MG (400 UNITS) capsule Take 400 Units by mouth daily.  . [DISCONTINUED] olmesartan-hydrochlorothiazide (BENICAR HCT) 40-25 MG tablet Take 1 tablet by mouth daily.  . [DISCONTINUED] rosuvastatin (CRESTOR) 20 MG tablet Take 20 mg by mouth at bedtime.     Allergies:    Rosuvastatin calcium   Social History: Social History   Socioeconomic History  . Marital status: Widowed    Spouse name: Not on file  . Number of children: 2  . Years of education: Not on file  . Highest education level: Not on file  Occupational History  . Occupation: Retired  Tobacco Use  . Smoking status: Former Smoker    Years: 3.00  . Smokeless tobacco: Never Used  Substance and Sexual Activity  . Alcohol use: Yes    Comment: 2 mixed drinks/ day  . Drug use: No  . Sexual activity: Not on file  Other Topics Concern  . Not on file  Social History Narrative  . Not on file   Social Determinants of Health   Financial Resource Strain:   . Difficulty of Paying Living Expenses: Not on file  Food Insecurity:   . Worried About Charity fundraiser in the Last Year: Not on file  . Ran Out of Food in the Last Year: Not on file  Transportation Needs:   . Lack of Transportation (Medical): Not on file  . Lack of Transportation (Non-Medical): Not on file  Physical Activity:   . Days of Exercise per Week: Not on file  . Minutes of Exercise per Session: Not on file  Stress:   . Feeling of Stress : Not on file  Social Connections:   . Frequency of Communication with Friends and Family: Not on file  . Frequency of Social Gatherings with Friends and Family:  Not on file  . Attends Religious Services: Not on file  . Active Member of Clubs or Organizations: Not on file  . Attends Archivist Meetings: Not on file  . Marital Status: Not on file     Family History: The patient's family history includes Heart disease in her father; Stomach cancer in her mother.  ROS:   All other ROS reviewed and negative. Pertinent positives  noted in the HPI.     EKGs/Labs/Other Studies Reviewed:   The following studies were personally reviewed by me today:   TTE 05/25/2020 1. Left ventricular ejection fraction, by estimation, is 60 to 65%. The  left ventricle has normal function. The left ventricle has no regional  wall motion abnormalities. Left ventricular diastolic parameters are  indeterminate. Elevated left ventricular  end-diastolic pressure. The average left ventricular global longitudinal  strain is -22.5 %. The global longitudinal strain is normal.  2. Right ventricular systolic function is normal. The right ventricular  size is normal. There is moderately elevated pulmonary artery systolic  pressure. The estimated right ventricular systolic pressure is 65.4 mmHg.  3. The mitral valve is normal in structure. Trivial mitral valve  regurgitation. No evidence of mitral stenosis.  4. Mild tricuspid stenosis.  5. The aortic valve is tricuspid. Aortic valve regurgitation is not  visualized. Mild to moderate aortic valve sclerosis/calcification is  present, without any evidence of aortic stenosis.  6. Aortic dilatation noted. There is mild dilatation of the ascending  aorta measuring 39 mm.  7. The inferior vena cava is dilated in size with >50% respiratory  variability, suggesting right atrial pressure of 8 mmHg.   NM Stress 05/25/2020  Nuclear stress EF: 73%.  The study is normal.  This is a low risk study.  The left ventricular ejection fraction is hyperdynamic (>65%).   Normal perfusion. LVEF 73% with normal wall motion.  This is a low risk study.   Recent Labs: 04/18/2020: ALT 12; BUN 18; Creatinine, Ser 0.93; Hemoglobin 13.6; Platelets 195; Potassium 4.2; Sodium 135 05/03/2020: BNP 119.8; TSH 5.810   Recent Lipid Panel No results found for: CHOL, TRIG, HDL, CHOLHDL, VLDL, LDLCALC, LDLDIRECT  Physical Exam:   VS:  BP (!) 124/56   Pulse 72   Ht 5\' 4"  (1.626 m)   Wt 190 lb (86.2 kg)   SpO2 97%   BMI 32.61 kg/m    Wt Readings from Last 3 Encounters:  08/07/20 190 lb (86.2 kg)  05/25/20 184 lb (83.5 kg)  05/15/20 185 lb (83.9 kg)    General: Well nourished, well developed, in no acute distress Heart: Atraumatic, normal size  Eyes: PEERLA, EOMI  Neck: Supple, no JVD Endocrine: No thryomegaly Cardiac: Normal S1, S2; RRR; no murmurs, rubs, or gallops Lungs: Clear to auscultation bilaterally, no wheezing, rhonchi or rales  Abd: Soft, nontender, no hepatomegaly  Ext: Trace edema Musculoskeletal: No deformities, BUE and BLE strength normal and equal Skin: Warm and dry, no rashes   Neuro: Alert and oriented to person, place, time, and situation, CNII-XII grossly intact, no focal deficits  Psych: Normal mood and affect   ASSESSMENT:   Pam Mejia is a 86 y.o. female who presents for the following: 1. DOE (dyspnea on exertion)   2. Essential hypertension   3. Mixed hyperlipidemia     PLAN:   1. DOE (dyspnea on exertion) -Still having shortness of breath.  Not grossly volume overloaded on exam.  No murmurs.  Echo with normal EF.  She had indeterminate diastolic function.  Did have an elevated RVSP up to around 42.  I do question HFpEF.  We will recheck a BNP today.  Her BNP was 119 on last visit.  We will go ahead and start her on Lasix 20 mg daily.  We will stop her HCTZ.  We will put her back on olmesartan 20 mg daily.  This may or may not help.  She  will let us know how this goes in the next few weeks.  If not she will need to have pulmonary evaluation.  2. Essential hypertension -Add back  olmesartan 20 mg single tablet.  Stop HCTZ.  Add Lasix as above.  3. Mixed hyperlipidemia -Continue Lipitor.  Disposition: Return in about 3 months (around 11/07/2020).  Medication Adjustments/Labs and Tests Ordered: Current medicines are reviewed at length with the patient today.  Concerns regarding medicines are outlined above.  Orders Placed This Encounter  Procedures  . Brain natriuretic peptide  . Basic metabolic panel   Meds ordered this encounter  Medications  . olmesartan (BENICAR) 20 MG tablet    Sig: Take 1 tablet (20 mg total) by mouth daily.    Dispense:  90 tablet    Refill:  1  . furosemide (LASIX) 20 MG tablet    Sig: Take 1 tablet (20 mg total) by mouth daily.    Dispense:  90 tablet    Refill:  3    Patient Instructions  Medication Instructions:  Stop Olmesartan-HCTZ mixed medication  Start Lasix 20 mg daily  Start Olmesartan 20 mg daily   *If you need a refill on your cardiac medications before your next appointment, please call your pharmacy*  Lab Work: BNP, BMET today   If you have labs (blood work) drawn today and your tests are completely normal, you will receive your results only by: Marland Kitchen MyChart Message (if you have MyChart) OR . A paper copy in the mail If you have any lab test that is abnormal or we need to change your treatment, we will call you to review the results.   Follow-Up: At [facility], you and your health needs are our priority.  As part of our continuing mission to provide you with exceptional heart care, we have created designated Provider Care Teams.  These Care Teams include your primary Cardiologist (physician) and Advanced Practice Providers (APPs -  Physician Assistants and Nurse Practitioners) who all work together to provide you with the care you need, when you need it.  We recommend signing up for the patient portal called "MyChart".  Sign up information is provided on this After Visit Summary.  MyChart is used to connect  with patients for Virtual Visits (Telemedicine).  Patients are able to view lab/test results, encounter notes, upcoming appointments, etc.  Non-urgent messages can be sent to your provider as well.   To learn more about what you can do with MyChart, go to [email or web address].    Your next appointment:   3 month(s)  The format for your next appointment:   In Person  Provider:   Eleonore Chiquito, MD       Time Spent with Patient: I have spent a total of 25 minutes with patient reviewing hospital notes, telemetry, EKGs, labs and examining the patient as well as establishing an assessment and plan that was discussed with the patient.  > 50% of time was spent in direct patient care.  Signed, Addison Naegeli. Audie Box, [place]  [address], [place],  [address] [phone]  08/07/2020 12:06 PM

## 2020-08-07 ENCOUNTER — Encounter: Payer: Self-pay | Admitting: Cardiovascular Disease

## 2020-08-07 ENCOUNTER — Ambulatory Visit (INDEPENDENT_AMBULATORY_CARE_PROVIDER_SITE_OTHER): Payer: Medicare Other | Admitting: Cardiovascular Disease

## 2020-08-07 ENCOUNTER — Other Ambulatory Visit: Payer: Self-pay

## 2020-08-07 VITALS — BP 124/56 | HR 72 | Ht 64.0 in | Wt 190.0 lb

## 2020-08-07 DIAGNOSIS — R0600 Dyspnea, unspecified: Secondary | ICD-10-CM

## 2020-08-07 DIAGNOSIS — I10 Essential (primary) hypertension: Secondary | ICD-10-CM

## 2020-08-07 DIAGNOSIS — E782 Mixed hyperlipidemia: Secondary | ICD-10-CM

## 2020-08-07 DIAGNOSIS — R0609 Other forms of dyspnea: Secondary | ICD-10-CM

## 2020-08-07 MED ORDER — FUROSEMIDE 20 MG PO TABS
20.0000 mg | ORAL_TABLET | Freq: Every day | ORAL | 3 refills | Status: DC
Start: 2020-08-07 — End: 2021-08-05

## 2020-08-07 MED ORDER — OLMESARTAN MEDOXOMIL 20 MG PO TABS: 20.0000 mg | ORAL_TABLET | Freq: Every day | ORAL | 1 refills | Status: AC

## 2020-08-07 NOTE — Patient Instructions (Signed)
Medication Instructions:  Stop Olmesartan-HCTZ mixed medication  Start Lasix 20 mg daily  Start Olmesartan 20 mg daily   *If you need a refill on your cardiac medications before your next appointment, please call your pharmacy*  Lab Work: BNP, BMET today   If you have labs (blood work) drawn today and your tests are completely normal, you will receive your results only by: Marland Kitchen MyChart Message (if you have MyChart) OR . A paper copy in the mail If you have any lab test that is abnormal or we need to change your treatment, we will call you to review the results.   Follow-Up: At [facility], you and your health needs are our priority.  As part of our continuing mission to provide you with exceptional heart care, we have created designated Provider Care Teams.  These Care Teams include your primary Cardiologist (physician) and Advanced Practice Providers (APPs -  Physician Assistants and Nurse Practitioners) who all work together to provide you with the care you need, when you need it.  We recommend signing up for the patient portal called "MyChart".  Sign up information is provided on this After Visit Summary.  MyChart is used to connect with patients for Virtual Visits (Telemedicine).  Patients are able to view lab/test results, encounter notes, upcoming appointments, etc.  Non-urgent messages can be sent to your provider as well.   To learn more about what you can do with MyChart, go to [email or web address].    Your next appointment:   3 month(s)  The format for your next appointment:   In Person  Provider:   Eleonore Chiquito, MD

## 2020-08-08 LAB — BASIC METABOLIC PANEL
BUN/Creatinine Ratio: 25 (ref 12–28)
BUN: 25 mg/dL (ref 8–27)
CO2: 24 mmol/L (ref 20–29)
Calcium: 9 mg/dL (ref 8.7–10.3)
Chloride: 96 mmol/L (ref 96–106)
Creatinine, Ser: 1 mg/dL (ref 0.57–1.00)
GFR calc Af Amer: 58 mL/min/{1.73_m2} — ABNORMAL LOW (ref >59)
GFR calc non Af Amer: 50 mL/min/{1.73_m2} — ABNORMAL LOW (ref >59)
Glucose: 84 mg/dL (ref 65–99)
Potassium: 4.9 mmol/L (ref 3.5–5.2)
Sodium: 131 mmol/L — ABNORMAL LOW (ref 134–144)

## 2020-08-08 LAB — BRAIN NATRIURETIC PEPTIDE: BNP: 232.8 pg/mL — ABNORMAL HIGH (ref 0.0–100.0)

## 2020-08-11 DIAGNOSIS — Z23 Encounter for immunization: Secondary | ICD-10-CM | POA: Diagnosis not present

## 2020-09-13 DIAGNOSIS — M791 Myalgia, unspecified site: Secondary | ICD-10-CM | POA: Diagnosis not present

## 2020-09-13 DIAGNOSIS — I10 Essential (primary) hypertension: Secondary | ICD-10-CM | POA: Diagnosis not present

## 2020-09-13 DIAGNOSIS — E785 Hyperlipidemia, unspecified: Secondary | ICD-10-CM | POA: Diagnosis not present

## 2020-11-04 NOTE — Progress Notes (Deleted)
Cardiology Office Note:   Date:  11/04/2020  NAME:  Pam Mejia    MRN: [id number] DOB:  13-Nov-1928   PCP:  Burnard Bunting, MD  Cardiologist:  No primary care provider on file.  Electrophysiologist:  None   Referring MD: Burnard Bunting, MD   No chief complaint on file. ***  History of Present Illness:   Pam Mejia is a 87 y.o. female with a hx of HTN, HLD, HFpEF who presents for follow-up. Seen at last visit with elevated BNP and started on lasix.    Problem List 1. HTN 2. TIA 3. Mild carotid artery disease -50% right external carotid disease 4. HFpEF -BNP 232 -Indeterminate diastolic function -negative NM Stress   Past Medical History: Past Medical History:  Diagnosis Date  . Anxiety   . BCC (basal cell carcinoma of skin) 08/19/1991   Left Jawline  . BCC (basal cell carcinoma of skin) 07/10/1993   Left Forehead  . BCC (basal cell carcinoma of skin) 04/15/2007   Right Chin  . BCC (basal cell carcinoma of skin) 08/23/2008   Left Cheek  . BCC (basal cell carcinoma of skin) 07/01/2010   Bridge Of Nose  . BCC (basal cell carcinoma of skin) 07/01/2010   Bulb Of Nose  . BCC (basal cell carcinoma of skin) 05/12/2011   Right Lower Cheek/Chin  . BCC (basal cell carcinoma of skin) w/ Sclerosis 04/15/2007   Crease Of Left Nare  . 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 Cancer ([place])    melanoma  . Depression   . Diverticulosis   . Elevated CK    likely secondary to trauma  . Glaucoma   . HTN (hypertension)   . Hyperlipidemia   . Migraine   . Osteoarthritis   . SCC (squamous cell carcinoma) Keratoacanthoma 12/11/2014   Left Jawline  . SCC (squamous cell carcinoma) Keratoacanthoma 08/04/2017   Right Hand  . Squamous cell carcinoma in situ (SCCIS) 03/16/2001   Left Collarbone  . Stroke [facility])    pt states she has had TIA's  . Superficial basal cell carcinoma (BCC) w/Adnexal Differentiation 08/29/2009   Right Frontal Hairline  .  Superficial nodular basal cell carcinoma (BCC) 08/04/2017   Right Nose  . Syncope   . UTI (urinary tract infection)     Past Surgical History: Past Surgical History:  Procedure Laterality Date  . BASAL CELL CARCINOMA EXCISION    . CATARACT EXTRACTION  2012   bilateral  . SQUAMOUS CELL CARCINOMA EXCISION      Current Medications: No outpatient medications have been marked as taking for the 11/07/20 encounter (Appointment) with Geralynn Rile, MD.     Allergies:    Rosuvastatin calcium   Social History: Social History   Socioeconomic History  . Marital status: Widowed    Spouse name: Not on file  . Number of children: 2  . Years of education: Not on file  . Highest education level: Not on file  Occupational History  . Occupation: Retired  Tobacco Use  . Smoking status: Former Smoker    Years: 3.00  . Smokeless tobacco: Never Used  Substance and Sexual Activity  . Alcohol use: Yes    Comment: 2 mixed drinks/ day  . Drug use: No  . Sexual activity: Not on file  Other Topics Concern  . Not on file  Social History Narrative  . Not on file   Social Determinants of Health   Financial  Resource Strain: Not on file  Food Insecurity: Not on file  Transportation Needs: Not on file  Physical Activity: Not on file  Stress: Not on file  Social Connections: Not on file     Family History: The patient's ***family history includes Heart disease in her father; Stomach cancer in her mother.  ROS:   All other ROS reviewed and negative. Pertinent positives noted in the HPI.     EKGs/Labs/Other Studies Reviewed:   The following studies were personally reviewed by me today:  EKG:  EKG is *** ordered today.  The ekg ordered today demonstrates ***, and was personally reviewed by me.   NM Stress 05/25/2020   Nuclear stress EF: 73%.  The study is normal.  This is a low risk study.  The left ventricular ejection fraction is hyperdynamic (>65%).   Normal perfusion.  LVEF 73% with normal wall motion. This is a low risk study.   TTE 05/25/2020  1. Left ventricular ejection fraction, by estimation, is 60 to 65%. The  left ventricle has normal function. The left ventricle has no regional  wall motion abnormalities. Left ventricular diastolic parameters are  indeterminate. Elevated left ventricular  end-diastolic pressure. The average left ventricular global longitudinal  strain is -22.5 %. The global longitudinal strain is normal.  2. Right ventricular systolic function is normal. The right ventricular  size is normal. There is moderately elevated pulmonary artery systolic  pressure. The estimated right ventricular systolic pressure is 0000000 mmHg.  3. The mitral valve is normal in structure. Trivial mitral valve  regurgitation. No evidence of mitral stenosis.  4. Mild tricuspid stenosis.  5. The aortic valve is tricuspid. Aortic valve regurgitation is not  visualized. Mild to moderate aortic valve sclerosis/calcification is  present, without any evidence of aortic stenosis.  6. Aortic dilatation noted. There is mild dilatation of the ascending  aorta measuring 39 mm.  7. The inferior vena cava is dilated in size with >50% respiratory  variability, suggesting right atrial pressure of 8 mmHg.   Recent Labs: 04/18/2020: ALT 12; Hemoglobin 13.6; Platelets 195 05/03/2020: TSH 5.810 08/07/2020: BNP 232.8; BUN 25; Creatinine, Ser 1.00; Potassium 4.9; Sodium 131   Recent Lipid Panel No results found for: CHOL, TRIG, HDL, CHOLHDL, VLDL, LDLCALC, LDLDIRECT  Physical Exam:   VS:  There were no vitals taken for this visit.   Wt Readings from Last 3 Encounters:  08/07/20 190 lb (86.2 kg)  05/25/20 184 lb (83.5 kg)  05/15/20 185 lb (83.9 kg)    General: Well nourished, well developed, in no acute distress Head: Atraumatic, normal size  Eyes: PEERLA, EOMI  Neck: Supple, no JVD Endocrine: No thryomegaly Cardiac: Normal S1, S2; RRR; no murmurs, rubs, or  gallops Lungs: Clear to auscultation bilaterally, no wheezing, rhonchi or rales  Abd: Soft, nontender, no hepatomegaly  Ext: No edema, pulses 2+ Musculoskeletal: No deformities, BUE and BLE strength normal and equal Skin: Warm and dry, no rashes   Neuro: Alert and oriented to person, place, time, and situation, CNII-XII grossly intact, no focal deficits  Psych: Normal mood and affect   ASSESSMENT:   Pam Mejia is a 87 y.o. female who presents for the following: No diagnosis found.  PLAN:   There are no diagnoses linked to this encounter.  Disposition: No follow-ups on file.  Medication Adjustments/Labs and Tests Ordered: Current medicines are reviewed at length with the patient today.  Concerns regarding medicines are outlined above.  No orders of the defined types  were placed in this encounter.  No orders of the defined types were placed in this encounter.   There are no Patient Instructions on file for this visit.   Time Spent with Patient: I have spent a total of *** minutes with patient reviewing hospital notes, telemetry, EKGs, labs and examining the patient as well as establishing an assessment and plan that was discussed with the patient.  > 50% of time was spent in direct patient care.  Signed, Lenna Gilford. Flora Lipps, MD [facility]  [address] [place], [place] [address] [phone]  11/04/2020 7:38 PM

## 2020-11-07 ENCOUNTER — Ambulatory Visit: Payer: Medicare Other | Admitting: Cardiovascular Disease

## 2020-11-07 DIAGNOSIS — I10 Essential (primary) hypertension: Secondary | ICD-10-CM

## 2020-11-07 DIAGNOSIS — I5032 Chronic diastolic (congestive) heart failure: Secondary | ICD-10-CM

## 2020-11-07 DIAGNOSIS — R0600 Dyspnea, unspecified: Secondary | ICD-10-CM

## 2020-12-31 DIAGNOSIS — E785 Hyperlipidemia, unspecified: Secondary | ICD-10-CM | POA: Diagnosis not present

## 2020-12-31 DIAGNOSIS — I10 Essential (primary) hypertension: Secondary | ICD-10-CM | POA: Diagnosis not present

## 2020-12-31 DIAGNOSIS — J449 Chronic obstructive pulmonary disease, unspecified: Secondary | ICD-10-CM | POA: Diagnosis not present

## 2020-12-31 DIAGNOSIS — E039 Hypothyroidism, unspecified: Secondary | ICD-10-CM | POA: Diagnosis not present

## 2021-01-02 DIAGNOSIS — E669 Obesity, unspecified: Secondary | ICD-10-CM | POA: Diagnosis not present

## 2021-01-02 DIAGNOSIS — I10 Essential (primary) hypertension: Secondary | ICD-10-CM | POA: Diagnosis not present

## 2021-01-02 DIAGNOSIS — J449 Chronic obstructive pulmonary disease, unspecified: Secondary | ICD-10-CM | POA: Diagnosis not present

## 2021-01-17 IMAGING — MR MR HEAD W/O CM
7 of 11 series · 25 of 48 positions shown · non-contrast
Comparison: Head CT 04/18/2020

CLINICAL DATA: Intermittent aphasia.  Dizziness.

EXAM:
MRI HEAD WITHOUT CONTRAST
TECHNIQUE: Multiplanar, multiecho pulse sequences of the brain and surrounding
structures were obtained without intravenous contrast.

[Series 2: DWI · axial · 3.0mm · 0.94mm/px · z∈[-73,+72]mm · 7 of 100 slices shown (1 of 2)]
[im 1/100]
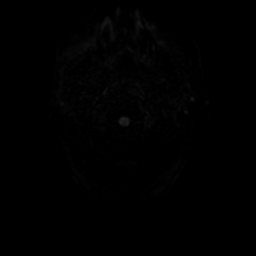
[im 17/100]
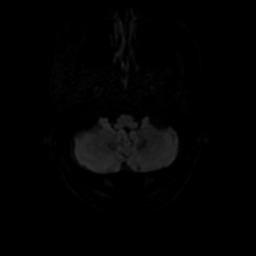
[im 34/100]
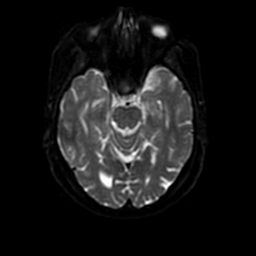
[im 50/100]
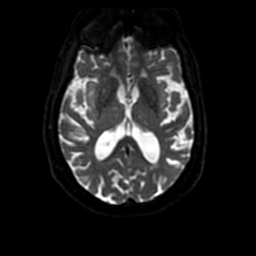
[im 67/100]
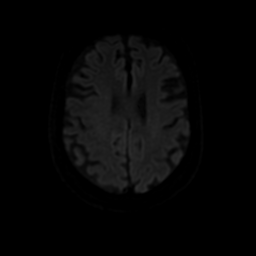
[im 83/100]
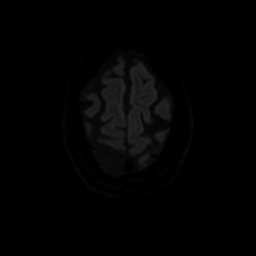
[im 100/100]
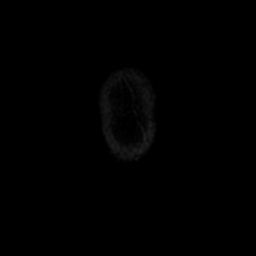

[Series 3: DWI · coronal · 4.0mm · 0.94mm/px · 6 of 74 slices shown (2 of 2)]
[im 1/74]
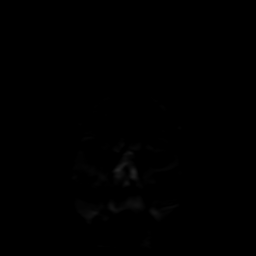
[im 15/74]
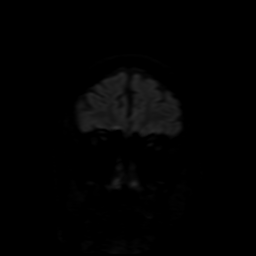
[im 30/74]
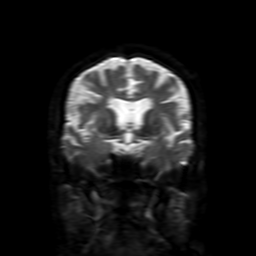
[im 44/74]
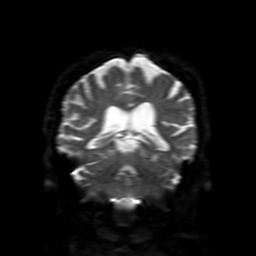
[im 59/74]
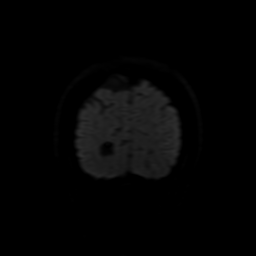
[im 74/74]
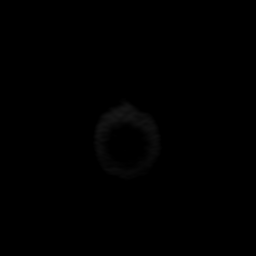

[Series 4: FLAIR · sagittal · 5.0mm · 0.23mm/px · 2 of 26 slices shown (1 of 2)]
[im 1/26]
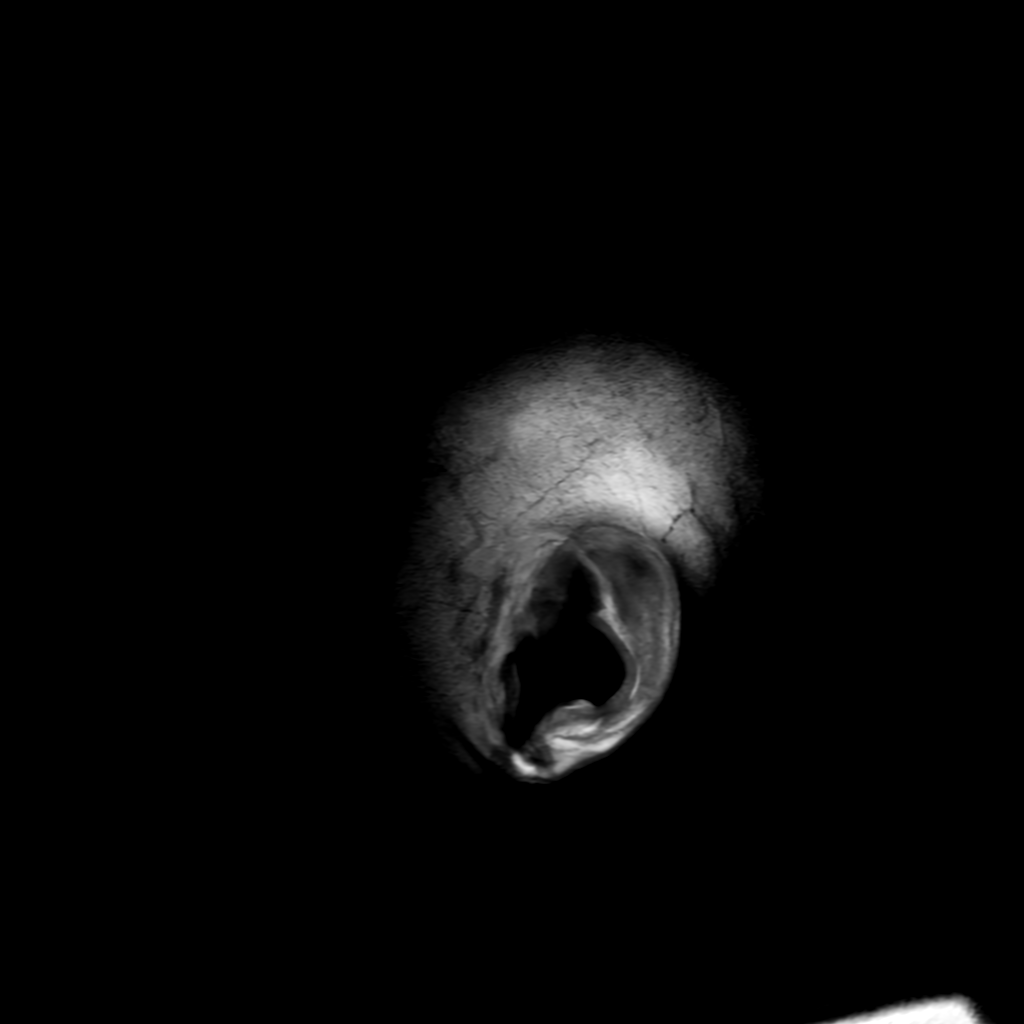
[im 26/26]
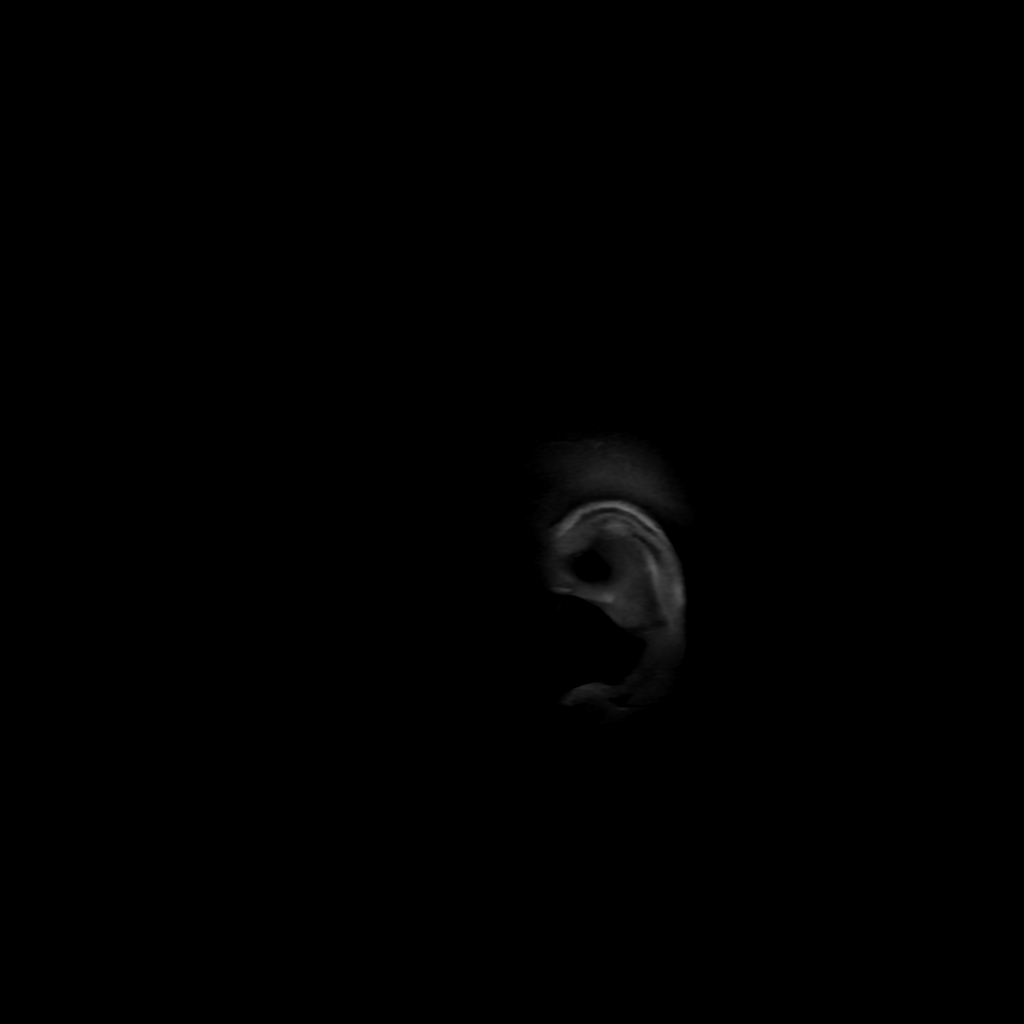

[Series 5: T2 · axial · 5.0mm · 0.23mm/px · 1 of 26 slices shown]
[im 1/26]
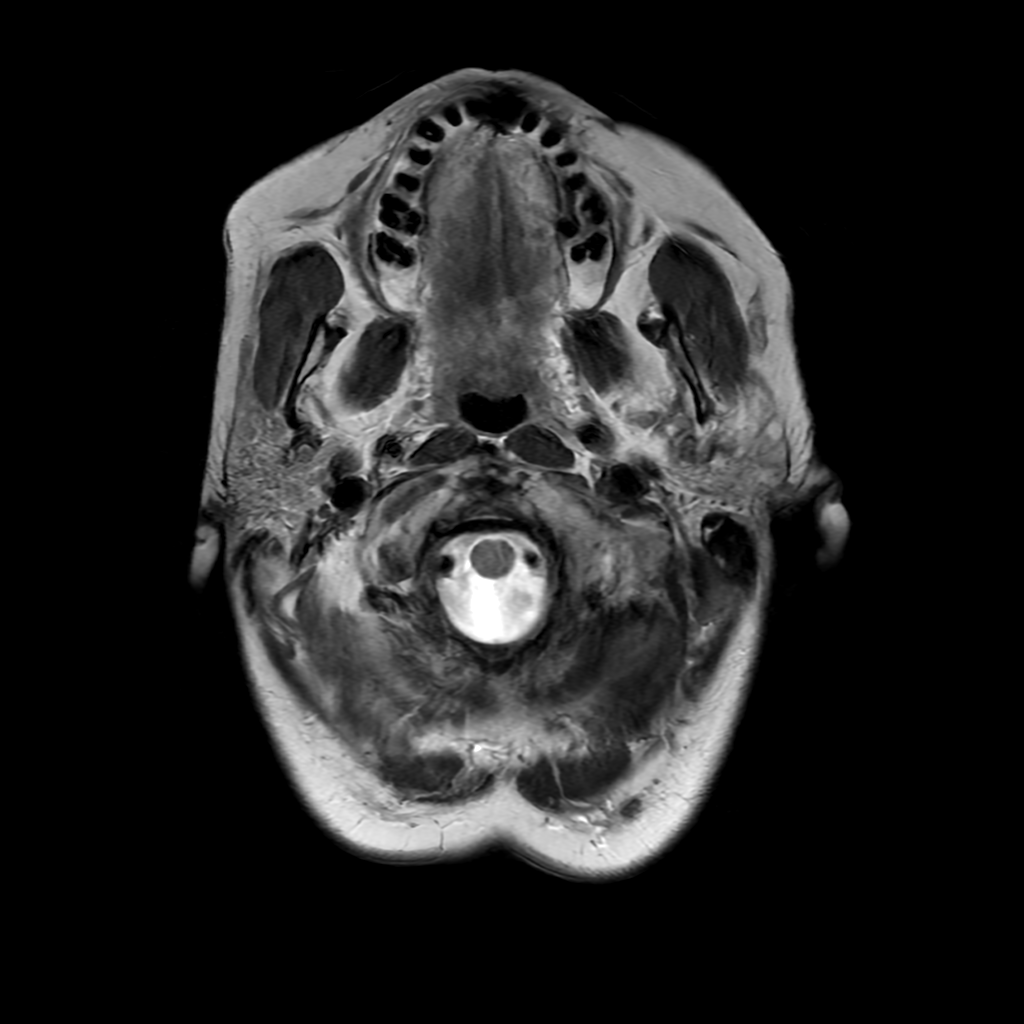

[Series 6: FLAIR · axial · 3.0mm · 0.41mm/px · z∈[-69,+79]mm · 2 of 26 slices shown (2 of 2)]
[im 1/26]
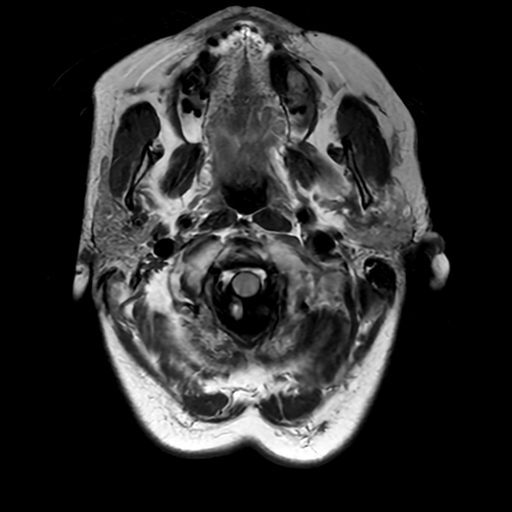
[im 26/26]
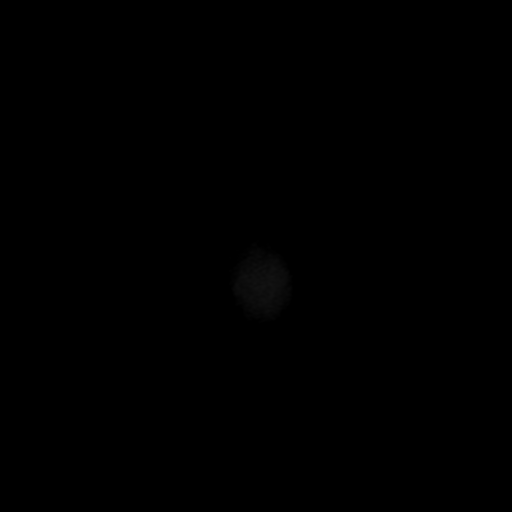

[Series 250: ADC · axial · 3.0mm · 0.94mm/px · z∈[-73,+72]mm · 4 of 50 slices shown (1 of 2)]
[im 1/50]
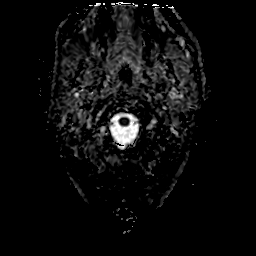
[im 17/50]
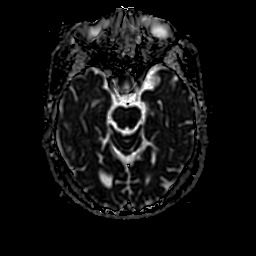
[im 33/50]
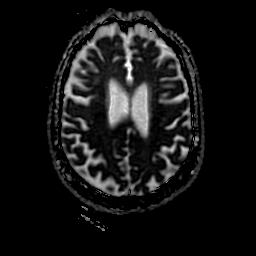
[im 50/50]
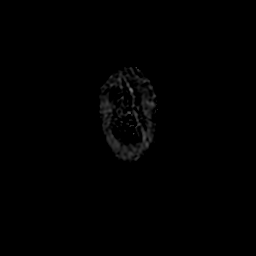

[Series 350: ADC · coronal · 4.0mm · 0.94mm/px · 3 of 37 slices shown (2 of 2)]
[im 1/37]
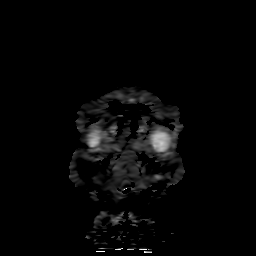
[im 19/37]
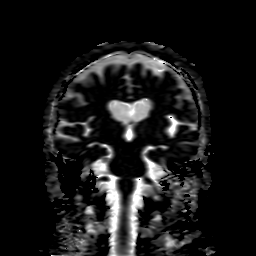
[im 37/37]
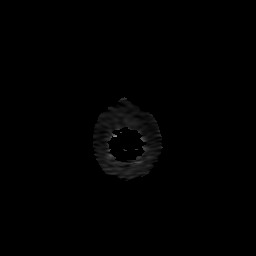

[25 of 48 positions shown; findings below may reference images not displayed]

FINDINGS: Brain: There is no evidence of acute infarct, intracranial
hemorrhage, mass, midline shift, or extra-axial fluid collection.
Scattered small foci of T2 hyperintensity in the cerebral white
matter and pons are nonspecific but compatible with mild chronic
small vessel ischemic disease. Asymmetric 3 cm focus of extra-axial
CSF over the right parietal convexity near the midline likely
reflects an incidental arachnoid cyst. There is moderate cerebral
atrophy.

Vascular: Major intracranial vascular flow voids are preserved.

Skull and upper cervical spine: Unremarkable bone marrow signal.

Sinuses/Orbits: Bilateral cataract extraction. Minimal left
maxillary sinus mucosal thickening. Trace left mastoid effusion.

Other: None.
IMPRESSION: 1. No acute intracranial abnormality.
2. Mild chronic small vessel ischemic disease and moderate cerebral
atrophy.

## 2021-01-25 DIAGNOSIS — Z1152 Encounter for screening for COVID-19: Secondary | ICD-10-CM | POA: Diagnosis not present

## 2021-01-25 DIAGNOSIS — J069 Acute upper respiratory infection, unspecified: Secondary | ICD-10-CM | POA: Diagnosis not present

## 2021-01-25 DIAGNOSIS — I10 Essential (primary) hypertension: Secondary | ICD-10-CM | POA: Diagnosis not present

## 2021-01-25 DIAGNOSIS — R059 Cough, unspecified: Secondary | ICD-10-CM | POA: Diagnosis not present

## 2021-01-30 DIAGNOSIS — Z961 Presence of intraocular lens: Secondary | ICD-10-CM | POA: Diagnosis not present

## 2021-01-30 DIAGNOSIS — H353131 Nonexudative age-related macular degeneration, bilateral, early dry stage: Secondary | ICD-10-CM | POA: Diagnosis not present

## 2021-01-31 DIAGNOSIS — E039 Hypothyroidism, unspecified: Secondary | ICD-10-CM | POA: Diagnosis not present

## 2021-01-31 DIAGNOSIS — I10 Essential (primary) hypertension: Secondary | ICD-10-CM | POA: Diagnosis not present

## 2021-01-31 DIAGNOSIS — J449 Chronic obstructive pulmonary disease, unspecified: Secondary | ICD-10-CM | POA: Diagnosis not present

## 2021-01-31 DIAGNOSIS — E785 Hyperlipidemia, unspecified: Secondary | ICD-10-CM | POA: Diagnosis not present

## 2021-04-29 DIAGNOSIS — Z1231 Encounter for screening mammogram for malignant neoplasm of breast: Secondary | ICD-10-CM | POA: Diagnosis not present

## 2021-05-24 ENCOUNTER — Other Ambulatory Visit: Payer: Self-pay | Admitting: *Deleted

## 2021-05-24 DIAGNOSIS — I6523 Occlusion and stenosis of bilateral carotid arteries: Secondary | ICD-10-CM

## 2021-05-30 DIAGNOSIS — L905 Scar conditions and fibrosis of skin: Secondary | ICD-10-CM | POA: Diagnosis not present

## 2021-05-30 DIAGNOSIS — L821 Other seborrheic keratosis: Secondary | ICD-10-CM | POA: Diagnosis not present

## 2021-05-30 DIAGNOSIS — C44529 Squamous cell carcinoma of skin of other part of trunk: Secondary | ICD-10-CM | POA: Diagnosis not present

## 2021-05-30 DIAGNOSIS — L570 Actinic keratosis: Secondary | ICD-10-CM | POA: Diagnosis not present

## 2021-05-30 DIAGNOSIS — D485 Neoplasm of uncertain behavior of skin: Secondary | ICD-10-CM | POA: Diagnosis not present

## 2021-05-30 DIAGNOSIS — C44629 Squamous cell carcinoma of skin of left upper limb, including shoulder: Secondary | ICD-10-CM | POA: Diagnosis not present

## 2021-06-02 DIAGNOSIS — E039 Hypothyroidism, unspecified: Secondary | ICD-10-CM | POA: Diagnosis not present

## 2021-06-02 DIAGNOSIS — J449 Chronic obstructive pulmonary disease, unspecified: Secondary | ICD-10-CM | POA: Diagnosis not present

## 2021-06-02 DIAGNOSIS — I10 Essential (primary) hypertension: Secondary | ICD-10-CM | POA: Diagnosis not present

## 2021-06-02 DIAGNOSIS — E785 Hyperlipidemia, unspecified: Secondary | ICD-10-CM | POA: Diagnosis not present

## 2021-06-04 ENCOUNTER — Ambulatory Visit (HOSPITAL_COMMUNITY)
Admission: RE | Admit: 2021-06-04 | Discharge: 2021-06-04 | Disposition: A | Discharge status: Home / Self Care | Payer: Medicare Other | Source: Ambulatory Visit | Attending: Vascular Surgery | Admitting: Vascular Surgery

## 2021-06-04 ENCOUNTER — Other Ambulatory Visit: Payer: Self-pay

## 2021-06-04 ENCOUNTER — Encounter: Payer: Self-pay | Admitting: Vascular Surgery

## 2021-06-04 ENCOUNTER — Ambulatory Visit: Payer: Medicare Other | Admitting: Vascular Surgery

## 2021-06-04 VITALS — BP 130/83 | HR 70 | Temp 98.0°F | Resp 16 | Ht 63.5 in | Wt 179.0 lb

## 2021-06-04 DIAGNOSIS — I6523 Occlusion and stenosis of bilateral carotid arteries: Secondary | ICD-10-CM | POA: Insufficient documentation

## 2021-06-04 NOTE — Progress Notes (Signed)
Patient name: Pam Mejia MRN: [id number] DOB: 1929/03/23 Sex: female  REASON FOR CONSULT: 1 year follow-up carotid artery disease  HPI: Pam Mejia is a 87 y.o. female, with history of hypertension, hyperlipidemia, melanoma, TIA, migraine that presents for 1 year follow-up carotid artery disease.  I initially saw her last year when in the middle of June she had an event at [facility] lives.  She states she woke up with a headache and also noted blurry wavy vision in both eyes while working a puzzle.  At the same time she had some trouble expressing some words that she describes as garbled.  She states ultimately she went to her PCP Dr. Reynaldo Minium with [facility] and was referred to the ED.  She had a stroke work-up there that did not show any acute events on her MRI.  CTA neck was negative for large vessel occlusion and only had some mild changes at the carotid bifurcation and then a short segment 40 to 50% stenosis at the origin of the right external carotid artery.    We recommend no surgical intervention last year and 1 year follow-up.  She states she had another event similar to this last year again while working a puzzle earlier this summer.  Described as garbled speech and having a hard time getting words out.  Is now back on 81 mg aspirin.  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    Left Eyebrow - SCC in Situ   Cancer ([place])    melanoma   Depression    Diverticulosis    Elevated CK    likely secondary to trauma   Glaucoma    HTN (hypertension)    Hyperlipidemia    Migraine    Osteoarthritis    SCC (squamous cell carcinoma) Keratoacanthoma 12/11/2014   Left Jawline   SCC (squamous cell carcinoma) Keratoacanthoma 08/04/2017   Right Hand   Squamous cell carcinoma in situ (SCCIS) 03/16/2001   Left Collarbone   Stroke ([place])    pt states she has had TIA's   Superficial basal cell carcinoma (BCC) w/Adnexal Differentiation 08/29/2009   Right Frontal Hairline   Superficial nodular basal cell carcinoma (BCC) 08/04/2017   Right Nose   Syncope    UTI (urinary tract infection)     Past Surgical History:  Procedure Laterality Date   BASAL CELL CARCINOMA EXCISION     CATARACT EXTRACTION  2012   bilateral   SQUAMOUS CELL CARCINOMA EXCISION      Family History  Problem Relation Age of Onset   Stomach cancer Mother    Heart disease Father     SOCIAL HISTORY: Social History   Socioeconomic History   Marital status: Widowed    Spouse name: Not on file   Number  of children: 2   Years of education: Not on file   Highest education level: Not on file  Occupational History   Occupation: Retired  Tobacco Use   Smoking status: Former    Years: 3.00    Types: Cigarettes   Smokeless tobacco: Never  Substance and Sexual Activity   Alcohol use: Yes    Comment: 2 mixed drinks/ day   Drug use: No   Sexual activity: Not on file  Other Topics Concern   Not on file  Social History Narrative   Not on file   Social Determinants of Health   Financial Resource Strain: Not on file  Food Insecurity: Not on file  Transportation Needs: Not on file  Physical Activity: Not on file  Stress: Not on file  Social Connections: Not on file  Intimate Partner Violence: Not on file    Allergies  Allergen Reactions   Olmesartan Other (See Comments)   Rosuvastatin  Calcium     Body aches and teeth pain.   Rosuvastatin Calcium Other (See Comments)    Current Outpatient Medications  Medication Sig Dispense Refill   Ascorbic Acid (VITAMIN C) 1000 MG tablet Take 1,000 mg by mouth daily.     aspirin 81 MG tablet Take 81 mg by mouth daily.       atorvastatin (LIPITOR) 40 MG tablet 40 mg.     calcium carbonate (TUMS - DOSED IN MG ELEMENTAL CALCIUM) 500 MG chewable tablet Chew 1 tablet by mouth daily as needed for indigestion or heartburn.     Calcium Carbonate-Vitamin D3 600-400 MG-UNIT TABS Take 1 tablet by mouth every other day.     diclofenac sodium (VOLTAREN) 1 % GEL Apply 1 application topically as needed (pain).      Multiple Vitamins-Minerals (PRESERVISION AREDS) CAPS Take by mouth daily.     Naproxen Sodium 220 MG CAPS Take 1 capsule by mouth in the morning and at bedtime.      pantoprazole (PROTONIX) 40 MG tablet Take 40 mg by mouth 2 (two) times daily.     polyvinyl alcohol (LIQUIFILM TEARS) 1.4 % ophthalmic solution Place 1 drop into both eyes as needed for dry eyes.      telmisartan (MICARDIS) 40 MG tablet      TURMERIC PO Take 1 tablet by mouth daily.     furosemide (LASIX) 20 MG tablet Take 1 tablet (20 mg total) by mouth daily. 90 tablet 3   olmesartan (BENICAR) 20 MG tablet Take 1 tablet (20 mg total) by mouth daily. (Patient not taking: Reported on 06/04/2021) 90 tablet 1   vitamin E 180 MG (400 UNITS) capsule Take 400 Units by mouth daily. (Patient not taking: Reported on 06/04/2021)     No current facility-administered medications for this visit.    REVIEW OF SYSTEMS:  '[X]'$  denotes positive finding, '[ ]'$  denotes negative finding Cardiac  Comments:  Chest pain or chest pressure:    Shortness of breath upon exertion:    Short of breath when lying flat:    Irregular heart rhythm:        Vascular    Pain in calf, thigh, or hip brought on by ambulation:    Pain in feet at night that wakes you up from your sleep:     Blood clot in your  veins:    Leg swelling:         Pulmonary    Oxygen at home:    Productive cough:     Wheezing:  Neurologic    Sudden weakness in arms or legs:     Sudden numbness in arms or legs:     Sudden onset of difficulty speaking or slurred speech:    Temporary loss of vision in one eye:     Problems with dizziness:         Gastrointestinal    Blood in stool:     Vomited blood:         Genitourinary    Burning when urinating:     Blood in urine:        Psychiatric    Major depression:         Hematologic    Bleeding problems:    Problems with blood clotting too easily:        Skin    Rashes or ulcers:        Constitutional    Fever or chills:      PHYSICAL EXAM: Vitals:   06/04/21 1143 06/04/21 1152  BP: 134/81 130/83  Pulse: 70 70  Resp: 16   Temp: 98 F (36.7 C)   TempSrc: Temporal   SpO2: 96%   Weight: 179 lb (81.2 kg)   Height: 5' 3.5" (1.613 m)     GENERAL: The patient is a well-nourished female, in no acute distress. The vital signs are documented above. CARDIAC: There is a regular rate and rhythm.  VASCULAR:  Palpable femoral pulses bilaterally PULMONARY: No respiratory distress. ABDOMEN: Soft and non-tender . MUSCULOSKELETAL: There are no major deformities or cyanosis. NEUROLOGIC: No focal weakness or paresthesias are detected.  Cranial nerves II through XII grossly intact.   SKIN: There are no ulcers or rashes noted. PSYCHIATRIC: The patient has a normal affect.  DATA:   Carotid duplex today shows minimal 1 to 39% stenosis bilaterally  Assessment/Plan:  87 year old female presents for 1 year follow-up of carotid disease after initial evaluation last year.  Stroke work-up last year showed an MRI that was negative for any acute events.  Her CTA neck did not show any significant bifurcation disease or in the ICA.  Sounds like she did have another event earlier this summer in June again with some word finding difficulty.  Her carotid duplex today  shows minimal 1 to 39% stenosis bilaterally consistent with her CTA last year and no progression of disease.  Again would not recommend any surgical intervention from my standpoint.  Would recommend follow-up in 3 years.  Can remain on aspirin for risk reduction.     Marty Heck, MD [facility] Office: [phone]

## 2021-06-28 DIAGNOSIS — L570 Actinic keratosis: Secondary | ICD-10-CM | POA: Diagnosis not present

## 2021-06-28 DIAGNOSIS — L905 Scar conditions and fibrosis of skin: Secondary | ICD-10-CM | POA: Diagnosis not present

## 2021-06-28 DIAGNOSIS — L820 Inflamed seborrheic keratosis: Secondary | ICD-10-CM | POA: Diagnosis not present

## 2021-06-28 DIAGNOSIS — D485 Neoplasm of uncertain behavior of skin: Secondary | ICD-10-CM | POA: Diagnosis not present

## 2021-06-28 DIAGNOSIS — D234 Other benign neoplasm of skin of scalp and neck: Secondary | ICD-10-CM | POA: Diagnosis not present

## 2021-06-28 DIAGNOSIS — D045 Carcinoma in situ of skin of trunk: Secondary | ICD-10-CM | POA: Diagnosis not present

## 2021-07-02 DIAGNOSIS — M859 Disorder of bone density and structure, unspecified: Secondary | ICD-10-CM | POA: Diagnosis not present

## 2021-07-02 DIAGNOSIS — E785 Hyperlipidemia, unspecified: Secondary | ICD-10-CM | POA: Diagnosis not present

## 2021-07-02 DIAGNOSIS — E039 Hypothyroidism, unspecified: Secondary | ICD-10-CM | POA: Diagnosis not present

## 2021-07-10 DIAGNOSIS — R82998 Other abnormal findings in urine: Secondary | ICD-10-CM | POA: Diagnosis not present

## 2021-07-10 DIAGNOSIS — I10 Essential (primary) hypertension: Secondary | ICD-10-CM | POA: Diagnosis not present

## 2021-07-12 DIAGNOSIS — L989 Disorder of the skin and subcutaneous tissue, unspecified: Secondary | ICD-10-CM | POA: Diagnosis not present

## 2021-07-12 DIAGNOSIS — L578 Other skin changes due to chronic exposure to nonionizing radiation: Secondary | ICD-10-CM | POA: Diagnosis not present

## 2021-07-12 DIAGNOSIS — C44629 Squamous cell carcinoma of skin of left upper limb, including shoulder: Secondary | ICD-10-CM | POA: Diagnosis not present

## 2021-07-12 DIAGNOSIS — Z1283 Encounter for screening for malignant neoplasm of skin: Secondary | ICD-10-CM | POA: Diagnosis not present

## 2021-07-12 DIAGNOSIS — B079 Viral wart, unspecified: Secondary | ICD-10-CM | POA: Diagnosis not present

## 2021-07-12 DIAGNOSIS — D0462 Carcinoma in situ of skin of left upper limb, including shoulder: Secondary | ICD-10-CM | POA: Diagnosis not present

## 2021-07-15 ENCOUNTER — Other Ambulatory Visit: Payer: Self-pay | Admitting: Internal Medicine

## 2021-07-15 DIAGNOSIS — R7989 Other specified abnormal findings of blood chemistry: Secondary | ICD-10-CM

## 2021-07-17 ENCOUNTER — Ambulatory Visit
Admission: RE | Admit: 2021-07-17 | Discharge: 2021-07-17 | Disposition: A | Discharge status: Home / Self Care | Payer: Medicare Other | Source: Ambulatory Visit | Attending: Internal Medicine | Admitting: Internal Medicine

## 2021-07-17 DIAGNOSIS — R7989 Other specified abnormal findings of blood chemistry: Secondary | ICD-10-CM

## 2021-07-17 DIAGNOSIS — R945 Abnormal results of liver function studies: Secondary | ICD-10-CM | POA: Diagnosis not present

## 2021-08-01 DIAGNOSIS — D0462 Carcinoma in situ of skin of left upper limb, including shoulder: Secondary | ICD-10-CM | POA: Diagnosis not present

## 2021-08-02 DIAGNOSIS — E039 Hypothyroidism, unspecified: Secondary | ICD-10-CM | POA: Diagnosis not present

## 2021-08-02 DIAGNOSIS — I10 Essential (primary) hypertension: Secondary | ICD-10-CM | POA: Diagnosis not present

## 2021-08-02 DIAGNOSIS — J449 Chronic obstructive pulmonary disease, unspecified: Secondary | ICD-10-CM | POA: Diagnosis not present

## 2021-08-02 DIAGNOSIS — E785 Hyperlipidemia, unspecified: Secondary | ICD-10-CM | POA: Diagnosis not present

## 2021-08-05 ENCOUNTER — Other Ambulatory Visit: Payer: Self-pay | Admitting: Cardiovascular Disease

## 2021-09-02 DIAGNOSIS — E785 Hyperlipidemia, unspecified: Secondary | ICD-10-CM | POA: Diagnosis not present

## 2021-09-02 DIAGNOSIS — J449 Chronic obstructive pulmonary disease, unspecified: Secondary | ICD-10-CM | POA: Diagnosis not present

## 2021-09-02 DIAGNOSIS — E039 Hypothyroidism, unspecified: Secondary | ICD-10-CM | POA: Diagnosis not present

## 2021-09-02 DIAGNOSIS — I10 Essential (primary) hypertension: Secondary | ICD-10-CM | POA: Diagnosis not present

## 2021-09-06 DIAGNOSIS — D0462 Carcinoma in situ of skin of left upper limb, including shoulder: Secondary | ICD-10-CM | POA: Diagnosis not present

## 2021-09-06 DIAGNOSIS — C44629 Squamous cell carcinoma of skin of left upper limb, including shoulder: Secondary | ICD-10-CM | POA: Diagnosis not present

## 2021-09-06 DIAGNOSIS — D485 Neoplasm of uncertain behavior of skin: Secondary | ICD-10-CM | POA: Diagnosis not present

## 2021-10-02 DIAGNOSIS — E785 Hyperlipidemia, unspecified: Secondary | ICD-10-CM | POA: Diagnosis not present

## 2021-10-02 DIAGNOSIS — I10 Essential (primary) hypertension: Secondary | ICD-10-CM | POA: Diagnosis not present

## 2021-10-02 DIAGNOSIS — E039 Hypothyroidism, unspecified: Secondary | ICD-10-CM | POA: Diagnosis not present

## 2021-10-02 DIAGNOSIS — J449 Chronic obstructive pulmonary disease, unspecified: Secondary | ICD-10-CM | POA: Diagnosis not present

## 2021-10-23 DIAGNOSIS — L570 Actinic keratosis: Secondary | ICD-10-CM | POA: Diagnosis not present

## 2021-10-23 DIAGNOSIS — L821 Other seborrheic keratosis: Secondary | ICD-10-CM | POA: Diagnosis not present

## 2021-10-23 DIAGNOSIS — D0462 Carcinoma in situ of skin of left upper limb, including shoulder: Secondary | ICD-10-CM | POA: Diagnosis not present

## 2021-12-01 DIAGNOSIS — I10 Essential (primary) hypertension: Secondary | ICD-10-CM | POA: Diagnosis not present

## 2021-12-01 DIAGNOSIS — E039 Hypothyroidism, unspecified: Secondary | ICD-10-CM | POA: Diagnosis not present

## 2021-12-01 DIAGNOSIS — E785 Hyperlipidemia, unspecified: Secondary | ICD-10-CM | POA: Diagnosis not present

## 2021-12-01 DIAGNOSIS — J449 Chronic obstructive pulmonary disease, unspecified: Secondary | ICD-10-CM | POA: Diagnosis not present

## 2021-12-31 DIAGNOSIS — E039 Hypothyroidism, unspecified: Secondary | ICD-10-CM | POA: Diagnosis not present

## 2021-12-31 DIAGNOSIS — I10 Essential (primary) hypertension: Secondary | ICD-10-CM | POA: Diagnosis not present

## 2021-12-31 DIAGNOSIS — E785 Hyperlipidemia, unspecified: Secondary | ICD-10-CM | POA: Diagnosis not present

## 2021-12-31 DIAGNOSIS — J449 Chronic obstructive pulmonary disease, unspecified: Secondary | ICD-10-CM | POA: Diagnosis not present

## 2022-01-17 DIAGNOSIS — D0462 Carcinoma in situ of skin of left upper limb, including shoulder: Secondary | ICD-10-CM | POA: Diagnosis not present

## 2022-01-17 DIAGNOSIS — L570 Actinic keratosis: Secondary | ICD-10-CM | POA: Diagnosis not present

## 2022-01-17 DIAGNOSIS — D485 Neoplasm of uncertain behavior of skin: Secondary | ICD-10-CM | POA: Diagnosis not present

## 2022-01-20 DIAGNOSIS — K0889 Other specified disorders of teeth and supporting structures: Secondary | ICD-10-CM | POA: Diagnosis not present

## 2022-01-20 DIAGNOSIS — E785 Hyperlipidemia, unspecified: Secondary | ICD-10-CM | POA: Diagnosis not present

## 2022-01-20 DIAGNOSIS — K047 Periapical abscess without sinus: Secondary | ICD-10-CM | POA: Diagnosis not present

## 2022-01-20 DIAGNOSIS — I10 Essential (primary) hypertension: Secondary | ICD-10-CM | POA: Diagnosis not present

## 2022-01-27 DIAGNOSIS — K047 Periapical abscess without sinus: Secondary | ICD-10-CM | POA: Diagnosis not present

## 2022-01-27 DIAGNOSIS — I872 Venous insufficiency (chronic) (peripheral): Secondary | ICD-10-CM | POA: Diagnosis not present

## 2022-01-27 DIAGNOSIS — I10 Essential (primary) hypertension: Secondary | ICD-10-CM | POA: Diagnosis not present

## 2022-01-27 DIAGNOSIS — E663 Overweight: Secondary | ICD-10-CM | POA: Diagnosis not present

## 2022-01-31 DIAGNOSIS — J449 Chronic obstructive pulmonary disease, unspecified: Secondary | ICD-10-CM | POA: Diagnosis not present

## 2022-01-31 DIAGNOSIS — E785 Hyperlipidemia, unspecified: Secondary | ICD-10-CM | POA: Diagnosis not present

## 2022-01-31 DIAGNOSIS — E039 Hypothyroidism, unspecified: Secondary | ICD-10-CM | POA: Diagnosis not present

## 2022-01-31 DIAGNOSIS — I10 Essential (primary) hypertension: Secondary | ICD-10-CM | POA: Diagnosis not present

## 2022-02-05 DIAGNOSIS — H52203 Unspecified astigmatism, bilateral: Secondary | ICD-10-CM | POA: Diagnosis not present

## 2022-02-05 DIAGNOSIS — H353131 Nonexudative age-related macular degeneration, bilateral, early dry stage: Secondary | ICD-10-CM | POA: Diagnosis not present

## 2022-02-05 DIAGNOSIS — Z961 Presence of intraocular lens: Secondary | ICD-10-CM | POA: Diagnosis not present

## 2022-02-05 DIAGNOSIS — H5203 Hypermetropia, bilateral: Secondary | ICD-10-CM | POA: Diagnosis not present

## 2022-02-26 DIAGNOSIS — D485 Neoplasm of uncertain behavior of skin: Secondary | ICD-10-CM | POA: Diagnosis not present

## 2022-02-26 DIAGNOSIS — C44629 Squamous cell carcinoma of skin of left upper limb, including shoulder: Secondary | ICD-10-CM | POA: Diagnosis not present

## 2022-03-26 DIAGNOSIS — L821 Other seborrheic keratosis: Secondary | ICD-10-CM | POA: Diagnosis not present

## 2022-03-26 DIAGNOSIS — C44311 Basal cell carcinoma of skin of nose: Secondary | ICD-10-CM | POA: Diagnosis not present

## 2022-03-26 DIAGNOSIS — C4401 Basal cell carcinoma of skin of lip: Secondary | ICD-10-CM | POA: Diagnosis not present

## 2022-03-26 DIAGNOSIS — D485 Neoplasm of uncertain behavior of skin: Secondary | ICD-10-CM | POA: Diagnosis not present

## 2022-04-17 IMAGING — US US ABDOMEN LIMITED
1 series · 14 of 25 positions shown · non-contrast
Comparison: None.

CLINICAL DATA: Elevated liver function tests.

EXAM:
ULTRASOUND ABDOMEN LIMITED RIGHT UPPER QUADRANT

[Series 1: us abdomen limited · 0.20mm/px · 14 of 52 slices shown]
[im 1/52]
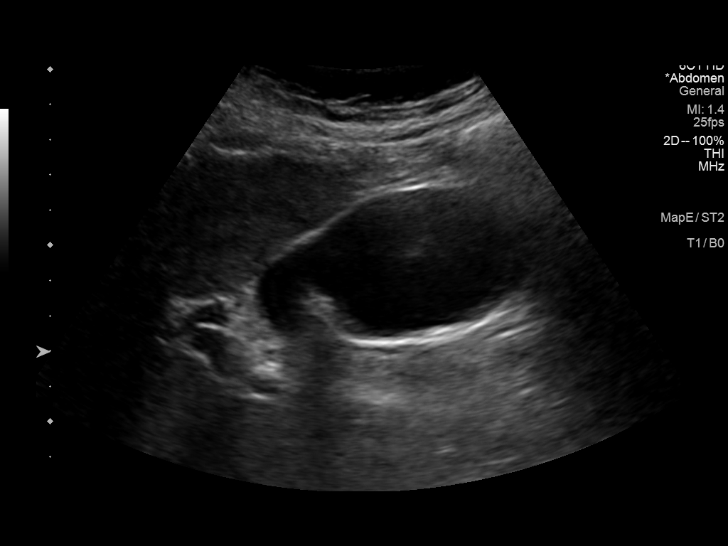
[im 5/52]
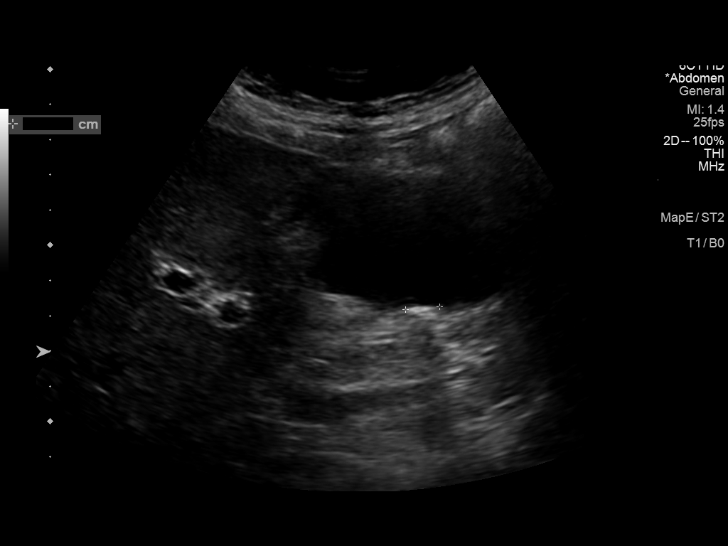
[im 9/52]
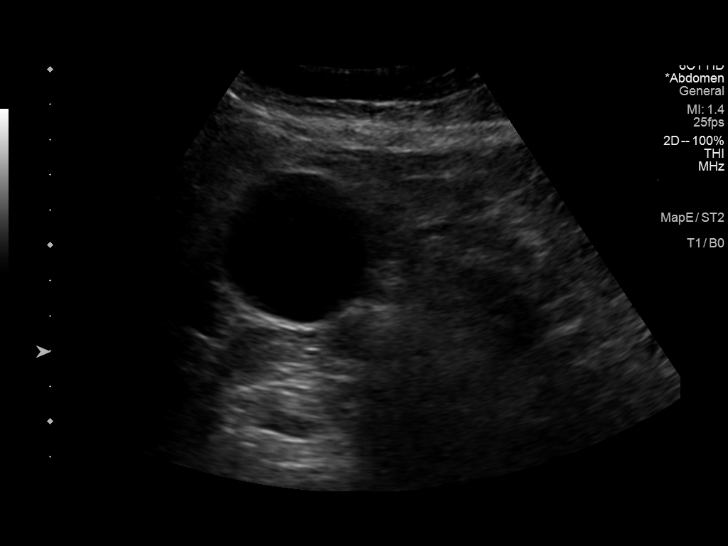
[im 13/52]
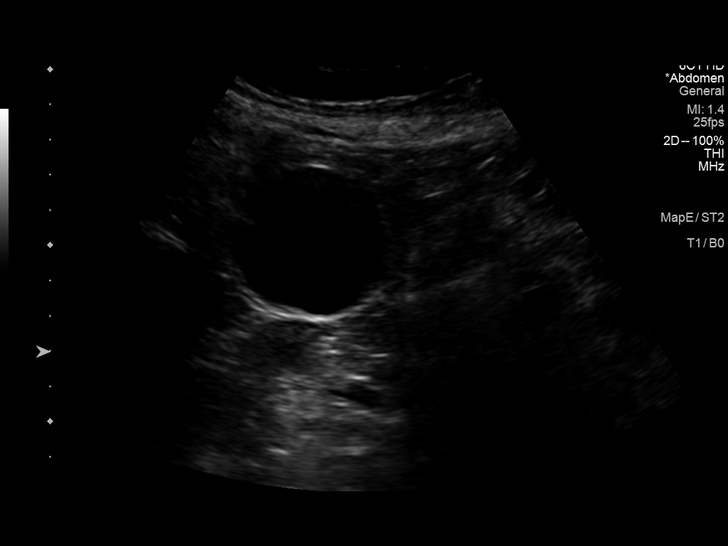
[im 18/52]
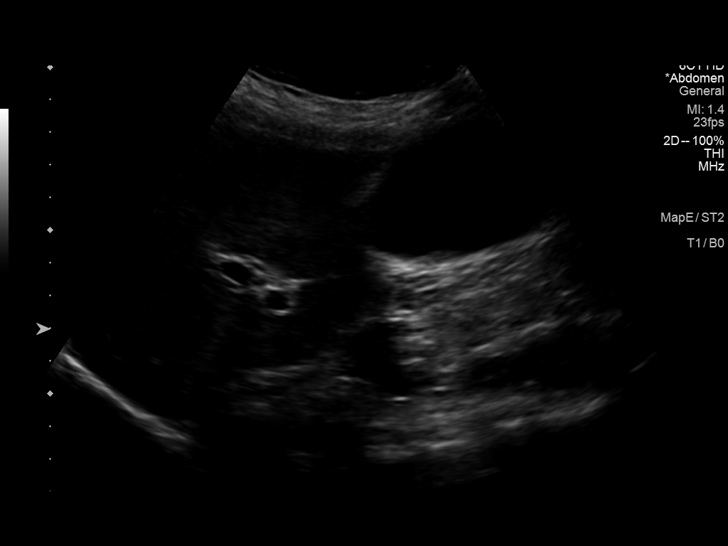
[im 20/52]
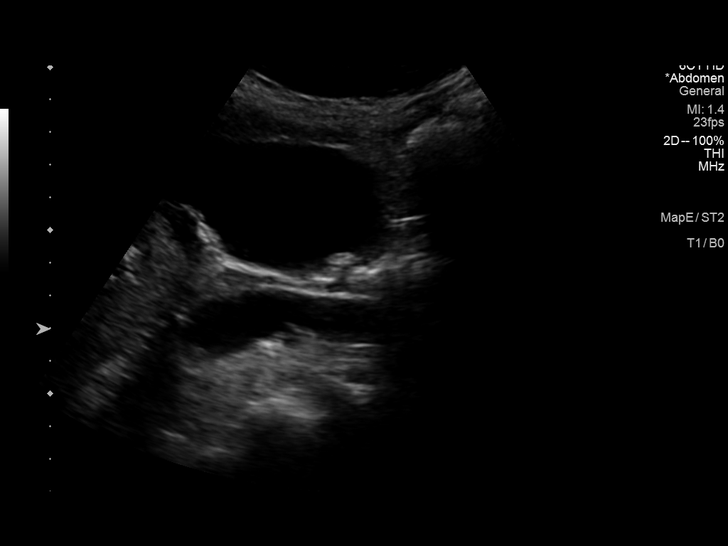
[im 24/52]
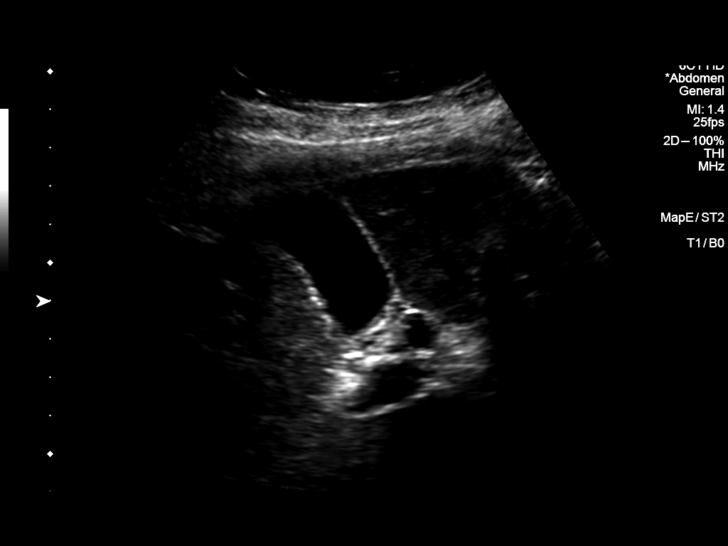
[im 28/52]
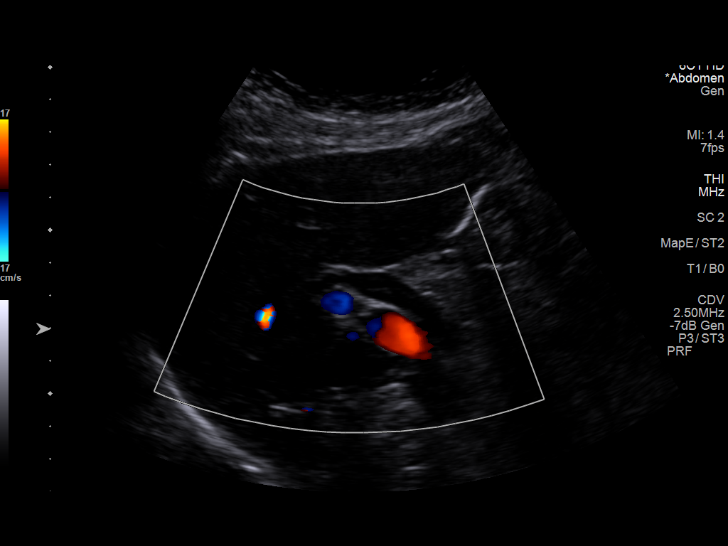
[im 32/52]
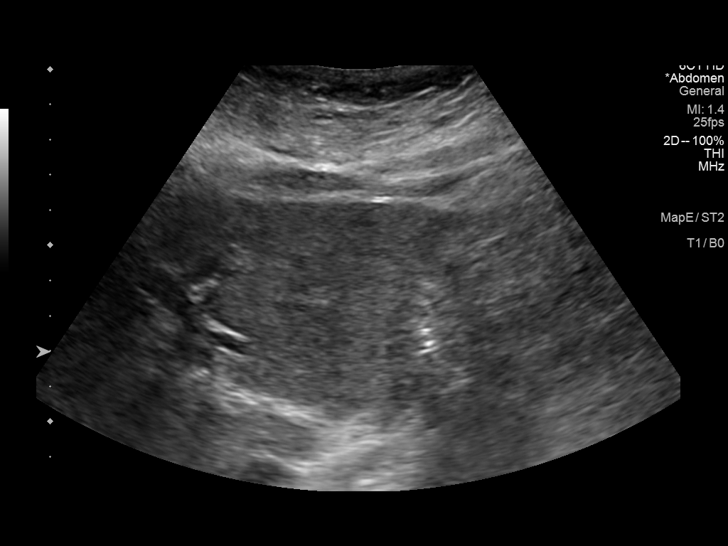
[im 35/52]
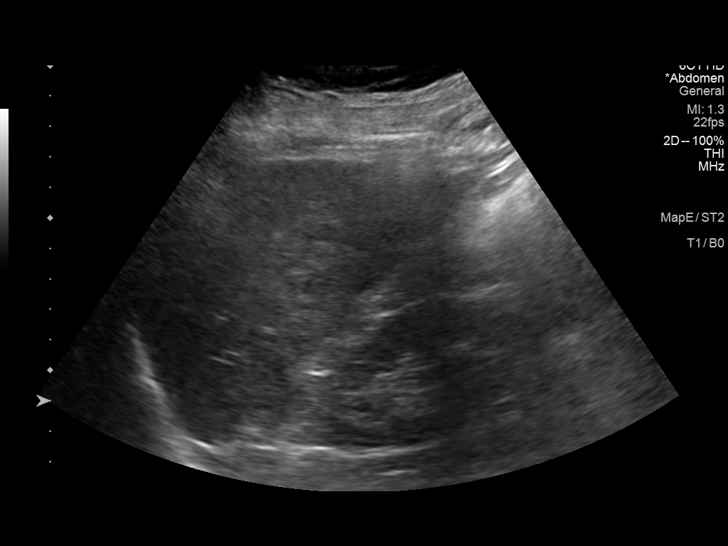
[im 39/52]
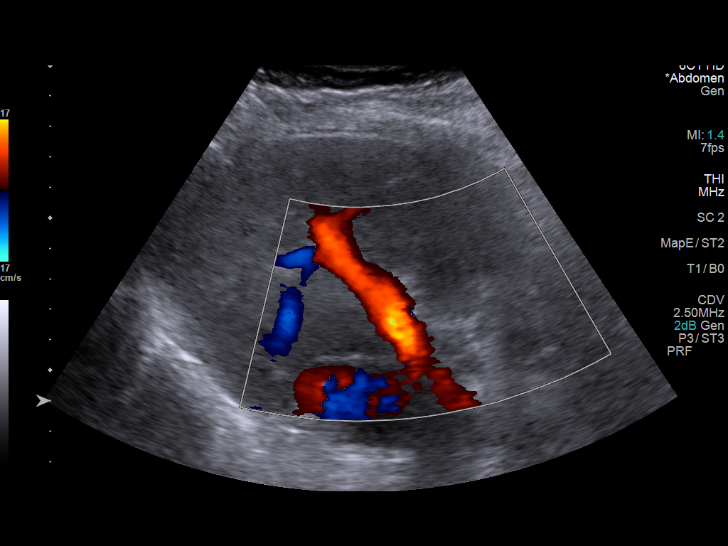
[im 43/52]
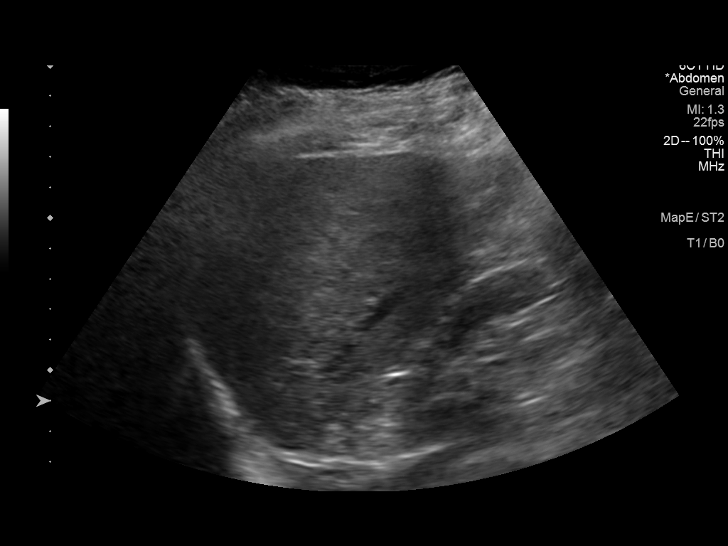
[im 47/52]
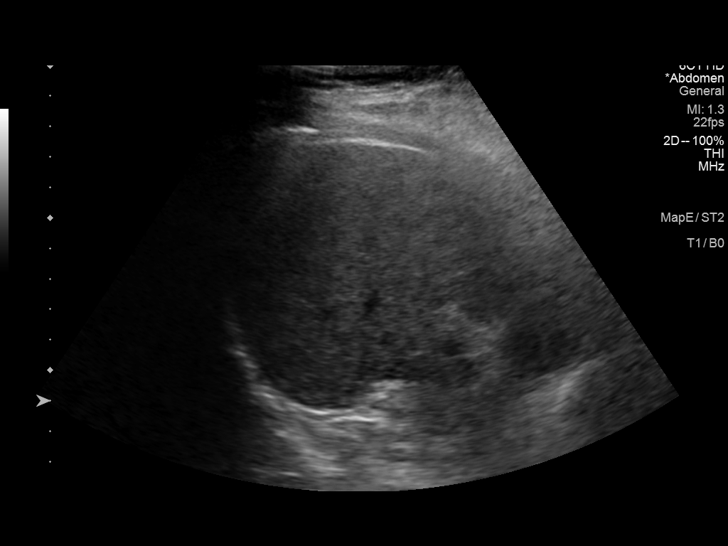
[im 52/52]
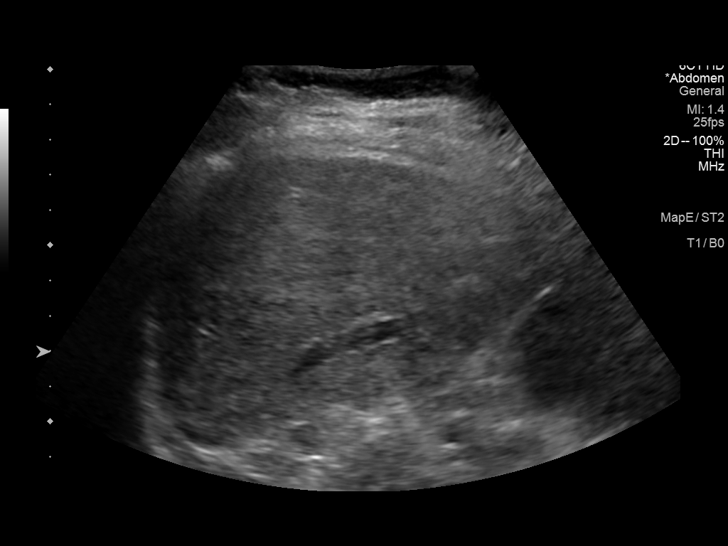

[14 of 25 positions shown; findings below may reference images not displayed]

FINDINGS: Gallbladder:

Tiny echogenic focus along the dependent mucosa of the gallbladder
may be a tiny polyp although some images suggest posterior acoustic
shadowing suggesting gallstone. No gallbladder wall thickening or
pericholecystic fluid. Sonographer reports no sonographic Murphy
sign.

Common bile duct:

Diameter: 8-9 mm

Liver:

Diffusely increased and coarsened echotexture without a discrete or
focal mass lesion evident. Portal vein is patent on color Doppler
imaging with normal direction of blood flow towards the liver.

Other: None.
IMPRESSION: 1. Possible tiny gallstone without gallbladder wall thickening or
pericholecystic fluid.
2. Mildly dilated common bile duct. MRCP or ERCP could be used to
further evaluate as clinically warranted.
3. Echogenic liver parenchyma suggests steatosis.

## 2022-05-13 DIAGNOSIS — Z1231 Encounter for screening mammogram for malignant neoplasm of breast: Secondary | ICD-10-CM | POA: Diagnosis not present

## 2022-05-28 DIAGNOSIS — L820 Inflamed seborrheic keratosis: Secondary | ICD-10-CM | POA: Diagnosis not present

## 2022-05-28 DIAGNOSIS — L538 Other specified erythematous conditions: Secondary | ICD-10-CM | POA: Diagnosis not present

## 2022-05-28 DIAGNOSIS — R208 Other disturbances of skin sensation: Secondary | ICD-10-CM | POA: Diagnosis not present

## 2022-05-28 DIAGNOSIS — D485 Neoplasm of uncertain behavior of skin: Secondary | ICD-10-CM | POA: Diagnosis not present

## 2022-06-02 DIAGNOSIS — I10 Essential (primary) hypertension: Secondary | ICD-10-CM | POA: Diagnosis not present

## 2022-06-02 DIAGNOSIS — E669 Obesity, unspecified: Secondary | ICD-10-CM | POA: Diagnosis not present

## 2022-06-16 DIAGNOSIS — C44311 Basal cell carcinoma of skin of nose: Secondary | ICD-10-CM | POA: Diagnosis not present

## 2022-07-14 DIAGNOSIS — I10 Essential (primary) hypertension: Secondary | ICD-10-CM | POA: Diagnosis not present

## 2022-07-14 DIAGNOSIS — E039 Hypothyroidism, unspecified: Secondary | ICD-10-CM | POA: Diagnosis not present

## 2022-07-14 DIAGNOSIS — E785 Hyperlipidemia, unspecified: Secondary | ICD-10-CM | POA: Diagnosis not present

## 2022-07-14 DIAGNOSIS — R7989 Other specified abnormal findings of blood chemistry: Secondary | ICD-10-CM | POA: Diagnosis not present

## 2022-07-21 ENCOUNTER — Other Ambulatory Visit: Payer: Self-pay | Admitting: Cardiovascular Disease

## 2022-07-21 DIAGNOSIS — J449 Chronic obstructive pulmonary disease, unspecified: Secondary | ICD-10-CM | POA: Diagnosis not present

## 2022-07-21 DIAGNOSIS — E785 Hyperlipidemia, unspecified: Secondary | ICD-10-CM | POA: Diagnosis not present

## 2022-07-21 DIAGNOSIS — I10 Essential (primary) hypertension: Secondary | ICD-10-CM | POA: Diagnosis not present

## 2022-07-21 DIAGNOSIS — Z0000 Encounter for general adult medical examination without abnormal findings: Secondary | ICD-10-CM | POA: Diagnosis not present

## 2022-08-04 DIAGNOSIS — C4401 Basal cell carcinoma of skin of lip: Secondary | ICD-10-CM | POA: Diagnosis not present

## 2022-09-09 DIAGNOSIS — M25552 Pain in left hip: Secondary | ICD-10-CM | POA: Diagnosis not present

## 2022-09-09 DIAGNOSIS — M1990 Unspecified osteoarthritis, unspecified site: Secondary | ICD-10-CM | POA: Diagnosis not present

## 2022-10-02 DIAGNOSIS — M1990 Unspecified osteoarthritis, unspecified site: Secondary | ICD-10-CM | POA: Diagnosis not present

## 2022-10-02 DIAGNOSIS — M25552 Pain in left hip: Secondary | ICD-10-CM | POA: Diagnosis not present

## 2022-10-02 DIAGNOSIS — M25561 Pain in right knee: Secondary | ICD-10-CM | POA: Diagnosis not present

## 2022-10-17 ENCOUNTER — Telehealth: Payer: Self-pay | Admitting: Orthopaedic Surgery

## 2022-10-17 ENCOUNTER — Ambulatory Visit (INDEPENDENT_AMBULATORY_CARE_PROVIDER_SITE_OTHER): Payer: Medicare Other

## 2022-10-17 ENCOUNTER — Ambulatory Visit: Payer: Medicare Other | Admitting: Orthopaedic Surgery

## 2022-10-17 DIAGNOSIS — M1612 Unilateral primary osteoarthritis, left hip: Secondary | ICD-10-CM

## 2022-10-17 DIAGNOSIS — M1711 Unilateral primary osteoarthritis, right knee: Secondary | ICD-10-CM | POA: Diagnosis not present

## 2022-10-17 DIAGNOSIS — M47816 Spondylosis without myelopathy or radiculopathy, lumbar region: Secondary | ICD-10-CM

## 2022-10-17 MED ORDER — DICLOFENAC SODIUM 75 MG PO TBEC: 75.0000 mg | DELAYED_RELEASE_TABLET | Freq: Two times a day (BID) | ORAL | 2 refills | Status: AC | PRN

## 2022-10-17 NOTE — Progress Notes (Unsigned)
Office Visit Note   Patient: Pam Mejia           Date of Birth: Feb 05, 1929           MRN: [id number] Visit Date: 10/17/2022              Requested by: Burnard Bunting, MD [address] [place],  [place] [address] PCP: Burnard Bunting, MD   Assessment & Plan: Visit Diagnoses:  1. Primary osteoarthritis of left hip   2. Primary osteoarthritis of right knee   3. Spondylosis without myelopathy or radiculopathy, lumbar region     Plan: Impression is right knee osteoarthritis and left buttock and left lower extremity pain referred from the lumbar spine.  In regards to the right knee, we have discussed proceeding with cortisone injection for which she is agreeable to.  In regards to the left buttock and leg pain I do not feel as though this is from her hip even though this is arthritic on x-ray.  I believe this is referred from her lumbar spine.  We have discussed starting her in physical therapy for which she is agreeable to.  She will follow-up with [place] as needed.  Follow-Up Instructions: Return if symptoms worsen or fail to improve.   Orders:  Orders Placed This Encounter  Procedures   Large Joint Inj   XR KNEE 3 VIEW RIGHT   XR HIP UNILAT W OR W/O PELVIS 2-3 VIEWS LEFT   XR Lumbar Spine 2-3 Views   Ambulatory referral to Physical Therapy   Meds ordered this encounter  Medications   diclofenac (VOLTAREN) 75 MG EC tablet    Sig: Take 1 tablet (75 mg total) by mouth 2 (two) times daily as needed.    Dispense:  60 tablet    Refill:  2      Procedures: Large Joint Inj: R knee on 10/19/2022 11:31 AM Indications: pain Details: 22 G needle  Arthrogram: No  Medications: 40 mg methylPREDNISolone acetate 40 MG/ML; 2 mL lidocaine 1 %; 2 mL bupivacaine 0.5 % Consent was given by the patient. Patient was prepped and draped in the usual sterile fashion.       Clinical Data: No additional findings.   Subjective: Chief Complaint  Patient presents with   Right Knee -  Pain   Left Hip - Pain    HPI patient is a pleasant 88 year old female who comes in today with right knee and left hip pain.  Regards to the right knee, her pain began a few years ago after sustaining a mechanical fall landing on the anterior aspect of the knee.  She has had pain primarily to the medial side since.  Symptoms are worse going from a seated to standing position.  She has been taking ibuprofen and Tylenol and using topical Voltaren without significant relief.  No previous cortisone injection that she can remember.  In regards to her left hip, the pain she has is primarily to the left buttock.  She has some radiation and achiness and pain to the left lateral lower leg.  Symptoms are worse going from seated to standing position.  She does note weakness in the left leg.  She gets some improvement with walking.  She also notes paresthesias to the left leg.  No history of lumbar or hip pathology.  Review of Systems as detailed in HPI.  All others reviewed and are negative.   Objective: Vital Signs: There were no vitals taken for this visit.  Physical Exam  well-developed well-nourished female in no acute distress.  Alert and oriented x 3.  Ortho Exam right knee exam shows a small effusion.  Range of motion 0 to 100 degrees.  Medial and lateral joint line tenderness.  Moderate patellofemoral crepitus.  Left hip exam reveals negative logroll negative FADIR.  Lumbar spine shows no spinous or paraspinous tenderness.  No pain with lumbar flexion or extension.  No focal weakness.  Positive straight leg raise.  She is neurovascular intact distally.  Specialty Comments:  No specialty comments available.  Imaging: No results found.   PMFS History: Patient Active Problem List   Diagnosis Date Noted   Carotid arterial disease ([place]) 05/15/2020   EDEMA 03/12/2010   DYSPNEA 03/12/2010   HYPERLIPIDEMIA-MIXED 02/06/2010   HYPERTENSION, UNSPECIFIED 02/06/2010   CHEST PAIN-UNSPECIFIED 02/06/2010    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   Left Eyebrow - SCC in Situ   Cancer ([place])    melanoma   Depression    Diverticulosis    Elevated CK    likely secondary to trauma   Glaucoma    HTN (hypertension)    Hyperlipidemia    Migraine    Osteoarthritis    SCC (squamous cell carcinoma) Keratoacanthoma 12/11/2014   Left Jawline   SCC (squamous cell carcinoma) Keratoacanthoma 08/04/2017   Right Hand   Squamous cell carcinoma in situ (SCCIS) 03/16/2001   Left Collarbone   Stroke ([place])    pt states she has had TIA's   Superficial basal cell carcinoma (BCC) w/Adnexal Differentiation 08/29/2009   Right Frontal Hairline   Superficial nodular basal cell carcinoma (BCC) 08/04/2017   Right Nose   Syncope    UTI (urinary tract infection)     Family History  Problem Relation Age of Onset   Stomach cancer Mother    Heart disease Father     Past Surgical History:  Procedure Laterality Date   BASAL CELL CARCINOMA EXCISION     CATARACT EXTRACTION  2012   bilateral   SQUAMOUS CELL CARCINOMA EXCISION     Social History   Occupational History   Occupation: Retired  Tobacco Use   Smoking status: Former    Years: 3.00    Types: Cigarettes   Smokeless tobacco: Never  Substance and Sexual Activity   Alcohol use: Yes    Comment: 2 mixed drinks/ day   Drug use: No   Sexual activity: Not on file

## 2022-10-17 NOTE — Telephone Encounter (Signed)
Patient called advised she uses [facility]. Patient asked if she can get a call when Rx is sent to the pharmacy. Patient said [facility] will deliver the Rx. The number to contact patient is [phone]

## 2022-10-17 NOTE — Telephone Encounter (Signed)
Called and notified patient.

## 2022-10-19 DIAGNOSIS — M1711 Unilateral primary osteoarthritis, right knee: Secondary | ICD-10-CM

## 2022-10-19 DIAGNOSIS — M1612 Unilateral primary osteoarthritis, left hip: Secondary | ICD-10-CM | POA: Diagnosis not present

## 2022-10-19 DIAGNOSIS — M47816 Spondylosis without myelopathy or radiculopathy, lumbar region: Secondary | ICD-10-CM | POA: Diagnosis not present

## 2022-10-19 MED ORDER — BUPIVACAINE HCL 0.5 % IJ SOLN
2.0000 mL | INTRAMUSCULAR | Status: AC | PRN
  Administered 2022-10-19: 2 mL via INTRA_ARTICULAR

## 2022-10-19 MED ORDER — METHYLPREDNISOLONE ACETATE 40 MG/ML IJ SUSP
40.0000 mg | INTRAMUSCULAR | Status: AC | PRN
  Administered 2022-10-19: 40 mg via INTRA_ARTICULAR

## 2022-10-19 MED ORDER — LIDOCAINE HCL 1 % IJ SOLN
2.0000 mL | INTRAMUSCULAR | Status: AC | PRN
  Administered 2022-10-19: 2 mL

## 2023-01-21 DIAGNOSIS — J449 Chronic obstructive pulmonary disease, unspecified: Secondary | ICD-10-CM | POA: Diagnosis not present

## 2023-01-21 DIAGNOSIS — I10 Essential (primary) hypertension: Secondary | ICD-10-CM | POA: Diagnosis not present

## 2023-01-21 DIAGNOSIS — I872 Venous insufficiency (chronic) (peripheral): Secondary | ICD-10-CM | POA: Diagnosis not present

## 2023-01-21 DIAGNOSIS — E669 Obesity, unspecified: Secondary | ICD-10-CM | POA: Diagnosis not present

## 2023-02-05 DIAGNOSIS — Z961 Presence of intraocular lens: Secondary | ICD-10-CM | POA: Diagnosis not present

## 2023-02-05 DIAGNOSIS — H353131 Nonexudative age-related macular degeneration, bilateral, early dry stage: Secondary | ICD-10-CM | POA: Diagnosis not present

## 2023-02-25 DIAGNOSIS — J449 Chronic obstructive pulmonary disease, unspecified: Secondary | ICD-10-CM | POA: Diagnosis not present

## 2023-02-25 DIAGNOSIS — E785 Hyperlipidemia, unspecified: Secondary | ICD-10-CM | POA: Diagnosis not present

## 2023-02-25 DIAGNOSIS — I10 Essential (primary) hypertension: Secondary | ICD-10-CM | POA: Diagnosis not present

## 2023-02-25 DIAGNOSIS — I872 Venous insufficiency (chronic) (peripheral): Secondary | ICD-10-CM | POA: Diagnosis not present

## 2023-05-19 DIAGNOSIS — Z1231 Encounter for screening mammogram for malignant neoplasm of breast: Secondary | ICD-10-CM | POA: Diagnosis not present

## 2023-07-21 DIAGNOSIS — E785 Hyperlipidemia, unspecified: Secondary | ICD-10-CM | POA: Diagnosis not present

## 2023-07-21 DIAGNOSIS — E039 Hypothyroidism, unspecified: Secondary | ICD-10-CM | POA: Diagnosis not present

## 2023-07-21 DIAGNOSIS — I10 Essential (primary) hypertension: Secondary | ICD-10-CM | POA: Diagnosis not present

## 2023-07-21 DIAGNOSIS — M8589 Other specified disorders of bone density and structure, multiple sites: Secondary | ICD-10-CM | POA: Diagnosis not present

## 2023-08-05 DIAGNOSIS — Z0000 Encounter for general adult medical examination without abnormal findings: Secondary | ICD-10-CM | POA: Diagnosis not present

## 2023-08-05 DIAGNOSIS — I10 Essential (primary) hypertension: Secondary | ICD-10-CM | POA: Diagnosis not present

## 2023-08-05 DIAGNOSIS — R82998 Other abnormal findings in urine: Secondary | ICD-10-CM | POA: Diagnosis not present

## 2023-08-05 DIAGNOSIS — Z1331 Encounter for screening for depression: Secondary | ICD-10-CM | POA: Diagnosis not present

## 2023-08-05 DIAGNOSIS — Z1339 Encounter for screening examination for other mental health and behavioral disorders: Secondary | ICD-10-CM | POA: Diagnosis not present

## 2023-08-05 DIAGNOSIS — Z23 Encounter for immunization: Secondary | ICD-10-CM | POA: Diagnosis not present

## 2023-08-25 DIAGNOSIS — I10 Essential (primary) hypertension: Secondary | ICD-10-CM | POA: Diagnosis not present

## 2023-09-09 DIAGNOSIS — I10 Essential (primary) hypertension: Secondary | ICD-10-CM | POA: Diagnosis not present

## 2023-09-09 DIAGNOSIS — J449 Chronic obstructive pulmonary disease, unspecified: Secondary | ICD-10-CM | POA: Diagnosis not present

## 2023-09-25 DIAGNOSIS — Z08 Encounter for follow-up examination after completed treatment for malignant neoplasm: Secondary | ICD-10-CM | POA: Diagnosis not present

## 2023-09-25 DIAGNOSIS — Z85828 Personal history of other malignant neoplasm of skin: Secondary | ICD-10-CM | POA: Diagnosis not present

## 2023-09-25 DIAGNOSIS — L814 Other melanin hyperpigmentation: Secondary | ICD-10-CM | POA: Diagnosis not present

## 2023-09-25 DIAGNOSIS — D485 Neoplasm of uncertain behavior of skin: Secondary | ICD-10-CM | POA: Diagnosis not present

## 2023-09-25 DIAGNOSIS — L821 Other seborrheic keratosis: Secondary | ICD-10-CM | POA: Diagnosis not present

## 2023-09-25 DIAGNOSIS — L570 Actinic keratosis: Secondary | ICD-10-CM | POA: Diagnosis not present

## 2023-12-11 DIAGNOSIS — J101 Influenza due to other identified influenza virus with other respiratory manifestations: Secondary | ICD-10-CM | POA: Diagnosis not present

## 2023-12-11 DIAGNOSIS — J029 Acute pharyngitis, unspecified: Secondary | ICD-10-CM | POA: Diagnosis not present

## 2024-02-17 DIAGNOSIS — J449 Chronic obstructive pulmonary disease, unspecified: Secondary | ICD-10-CM | POA: Diagnosis not present

## 2024-02-18 DIAGNOSIS — H5203 Hypermetropia, bilateral: Secondary | ICD-10-CM | POA: Diagnosis not present

## 2024-02-22 DIAGNOSIS — K080 Exfoliation of teeth due to systemic causes: Secondary | ICD-10-CM | POA: Diagnosis not present

## 2024-02-24 DIAGNOSIS — K080 Exfoliation of teeth due to systemic causes: Secondary | ICD-10-CM | POA: Diagnosis not present

## 2024-04-25 DIAGNOSIS — D485 Neoplasm of uncertain behavior of skin: Secondary | ICD-10-CM | POA: Diagnosis not present

## 2024-04-25 DIAGNOSIS — L723 Sebaceous cyst: Secondary | ICD-10-CM | POA: Diagnosis not present

## 2024-04-25 DIAGNOSIS — C44729 Squamous cell carcinoma of skin of left lower limb, including hip: Secondary | ICD-10-CM | POA: Diagnosis not present

## 2024-05-19 DIAGNOSIS — C44729 Squamous cell carcinoma of skin of left lower limb, including hip: Secondary | ICD-10-CM | POA: Diagnosis not present

## 2024-05-26 DIAGNOSIS — Z1231 Encounter for screening mammogram for malignant neoplasm of breast: Secondary | ICD-10-CM | POA: Diagnosis not present

## 2024-06-07 ENCOUNTER — Other Ambulatory Visit: Payer: Self-pay | Admitting: Vascular Surgery

## 2024-06-07 DIAGNOSIS — I6523 Occlusion and stenosis of bilateral carotid arteries: Secondary | ICD-10-CM

## 2024-07-12 ENCOUNTER — Ambulatory Visit (HOSPITAL_COMMUNITY)
Admission: RE | Admit: 2024-07-12 | Discharge: 2024-07-12 | Discharge status: Home / Self Care | Source: Ambulatory Visit | Attending: Vascular Surgery

## 2024-07-12 ENCOUNTER — Ambulatory Visit: Attending: Vascular Surgery | Admitting: Vascular Surgery

## 2024-07-12 ENCOUNTER — Encounter: Payer: Self-pay | Admitting: Vascular Surgery

## 2024-07-12 VITALS — BP 127/75 | HR 54 | Temp 97.9°F | Ht 63.5 in | Wt 166.0 lb

## 2024-07-12 DIAGNOSIS — I6523 Occlusion and stenosis of bilateral carotid arteries: Secondary | ICD-10-CM | POA: Insufficient documentation

## 2024-07-12 NOTE — Progress Notes (Signed)
 Patient name: Pam Mejia MRN: [id number] DOB: [birth year] Sex: female  REASON FOR CONSULT: 3-year follow-up for surveillance of carotid artery disease  HPI: Pam Mejia is a 89 y.o. female, with hx HTN, HLD that presents for 3-year follow-up for surveillance of her carotid artery disease.  Her carotid disease was previously identified on CTA neck for stroke workup in 2022.  She had some mild changes at the carotid bifurcation.  [facility]  She reports no history of stroke or TIA.  Overall doing well.  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   Left Eyebrow - SCC in Situ   Cancer ([facility])    melanoma   Depression    Diverticulosis    Elevated CK    likely secondary to trauma   Glaucoma    HTN (hypertension)    Hyperlipidemia    Migraine    Osteoarthritis    SCC (squamous cell carcinoma) Keratoacanthoma 12/11/2014   Left Jawline   SCC (squamous cell carcinoma) Keratoacanthoma 08/04/2017   Right Hand   Squamous cell carcinoma in situ (SCCIS) 03/16/2001   Left Collarbone   Stroke ([facility])    pt states she has had TIA's   Superficial basal cell carcinoma (BCC) w/Adnexal Differentiation 08/29/2009   Right Frontal Hairline   Superficial nodular basal cell carcinoma (BCC) 08/04/2017   Right Nose   Syncope    UTI (urinary tract infection)     Past Surgical History:  Procedure  Laterality Date   BASAL CELL CARCINOMA EXCISION     CATARACT EXTRACTION  2012   bilateral   SQUAMOUS CELL CARCINOMA EXCISION      Family History  Problem Relation Age of Onset   Stomach cancer Mother    Heart disease Father     SOCIAL HISTORY: Social History   Socioeconomic History   Marital status: Widowed    Spouse name: Not on file   Number of children: 2   Years of education: Not on file   Highest education level: Not on file  Occupational History   Occupation: Retired  Tobacco Use   Smoking status: Former    Types: Cigarettes   Smokeless tobacco: Never  Substance and Sexual Activity   Alcohol  use: Yes    Comment: 2 mixed drinks/ day   Drug use: No   Sexual activity: Not on file  Other Topics Concern   Not on file  Social History Narrative   Not on file   Social Drivers of Health   Financial Resource Strain: Not on file  Food Insecurity: Not on file  Transportation Needs: Not on file  Physical Activity: Not on file  Stress: Not on file  Social Connections: Not on file  Intimate Partner Violence: Not on file  Allergies  Allergen Reactions   Olmesartan  Other (See Comments)   Rosuvastatin Calcium     Body aches and teeth pain.   Rosuvastatin Calcium Other (See Comments)    Current Outpatient Medications  Medication Sig Dispense Refill   Ascorbic Acid (VITAMIN C) 1000 MG tablet Take 1,000 mg by mouth daily.     aspirin 81 MG tablet Take 81 mg by mouth daily.       atorvastatin (LIPITOR) 40 MG tablet 40 mg.     calcium carbonate (TUMS - DOSED IN MG ELEMENTAL CALCIUM) 500 MG chewable tablet Chew 1 tablet by mouth daily as needed for indigestion or heartburn.     Calcium Carbonate-Vitamin D3 600-400 MG-UNIT TABS Take 1 tablet by mouth every other day.     diclofenac  (VOLTAREN ) 75 MG EC tablet Take 1 tablet (75 mg total) by mouth 2 (two) times daily as needed. 60 tablet 2   diclofenac  sodium (VOLTAREN ) 1 % GEL Apply 1 application topically as needed  (pain).      furosemide  (LASIX ) 20 MG tablet Take 1 tablet (20 mg total) by mouth daily. SCHEDULE OFFICE APPOINTMENT FOR FUTURE REFILLS. 30 tablet 0   Multiple Vitamins-Minerals (PRESERVISION AREDS) CAPS Take by mouth daily.     Naproxen Sodium 220 MG CAPS Take 1 capsule by mouth in the morning and at bedtime.      olmesartan  (BENICAR ) 20 MG tablet Take 1 tablet (20 mg total) by mouth daily. 90 tablet 1   pantoprazole (PROTONIX) 40 MG tablet Take 40 mg by mouth 2 (two) times daily.     polyvinyl alcohol  (LIQUIFILM TEARS) 1.4 % ophthalmic solution Place 1 drop into both eyes as needed for dry eyes.      telmisartan (MICARDIS) 40 MG tablet      TURMERIC PO Take 1 tablet by mouth daily.     vitamin E 180 MG (400 UNITS) capsule Take 400 Units by mouth daily.     No current facility-administered medications for this visit.    REVIEW OF SYSTEMS:  [X]  denotes positive finding, [ ]  denotes negative finding Cardiac  Comments:  Chest pain or chest pressure:    Shortness of breath upon exertion:    Short of breath when lying flat:    Irregular heart rhythm:        Vascular    Pain in calf, thigh, or hip brought on by ambulation:    Pain in feet at night that wakes you up from your sleep:     Blood clot in your veins:    Leg swelling:         Pulmonary    Oxygen at home:    Productive cough:     Wheezing:         Neurologic    Sudden weakness in arms or legs:     Sudden numbness in arms or legs:     Sudden onset of difficulty speaking or slurred speech:    Temporary loss of vision in one eye:     Problems with dizziness:         Gastrointestinal    Blood in stool:     Vomited blood:         Genitourinary    Burning when urinating:     Blood in urine:        Psychiatric    Major depression:         Hematologic    Bleeding problems:    Problems with blood  clotting too easily:        Skin    Rashes or ulcers:        Constitutional    Fever or chills:      PHYSICAL  EXAM: Vitals:   07/12/24 1401 07/12/24 1403  BP: 133/77 127/75  Pulse: (!) 54   Temp: 97.9 F (36.6 C)   SpO2: 95%   Weight: 166 lb (75.3 kg)   Height: 5' 3.5 (1.613 m)     GENERAL: The patient is a well-nourished female, in no acute distress. The vital signs are documented above. CARDIAC: There is a regular rate and rhythm.  VASCULAR:  Bilateral femoral pulses palpable Bilateral PT pulses palpable PULMONARY: No respiratory distress. ABDOMEN: Soft and non-tender. MUSCULOSKELETAL: There are no major deformities or cyanosis. NEUROLOGIC: No focal weakness or paresthesias are detected.  CN II-XII grossly intact. SKIN: There are no ulcers or rashes noted. PSYCHIATRIC: The patient has a normal affect.  DATA:   Carotid ultrasound today shows near normal vessels with no significant stenosis  Assessment/Plan:  89 y.o. female, with hx HTN, HLD that presents for 3-year follow-up for surveillance of her carotid artery disease.  Her carotid disease was previously identified on CTA neck for stroke workup in 2022.  She had some mild changes at the carotid bifurcation.  Overall she is doing really well.  Discussed her carotid ultrasound today shows essentially normal-appearing vessels with no significant stenosis.  I think at the age of 29 it would be reasonable to just follow-up with me as needed at this point.  She has had no change in her carotid study over the last 3 years.  She has follow-up with Dr. Shepard her PCP later this year.  Happy to see her if needed in the future.   Lonni DOROTHA Gaskins, MD [facility] Office: [phone]

## 2024-08-10 DIAGNOSIS — E039 Hypothyroidism, unspecified: Secondary | ICD-10-CM | POA: Diagnosis not present

## 2024-08-10 DIAGNOSIS — Z0189 Encounter for other specified special examinations: Secondary | ICD-10-CM | POA: Diagnosis not present

## 2024-08-10 DIAGNOSIS — M8580 Other specified disorders of bone density and structure, unspecified site: Secondary | ICD-10-CM | POA: Diagnosis not present

## 2024-08-17 DIAGNOSIS — I10 Essential (primary) hypertension: Secondary | ICD-10-CM | POA: Diagnosis not present

## 2024-08-17 DIAGNOSIS — R82998 Other abnormal findings in urine: Secondary | ICD-10-CM | POA: Diagnosis not present

## 2024-08-17 DIAGNOSIS — Z23 Encounter for immunization: Secondary | ICD-10-CM | POA: Diagnosis not present

## 2024-08-17 DIAGNOSIS — Z0000 Encounter for general adult medical examination without abnormal findings: Secondary | ICD-10-CM | POA: Diagnosis not present

## 2024-08-17 DIAGNOSIS — Z1339 Encounter for screening examination for other mental health and behavioral disorders: Secondary | ICD-10-CM | POA: Diagnosis not present

## 2024-08-17 DIAGNOSIS — Z1331 Encounter for screening for depression: Secondary | ICD-10-CM | POA: Diagnosis not present
